# Patient Record
Sex: Female | Born: 1985 | Race: Black or African American | Hispanic: No | Marital: Single | Smoking: Former smoker
Health system: Southern US, Community
[De-identification: ages and names within clinical notes are randomized; demographics above are authoritative.]

## PROBLEM LIST (undated history)

## (undated) ENCOUNTER — Inpatient Hospital Stay: Payer: Self-pay

## (undated) DIAGNOSIS — I10 Essential (primary) hypertension: Secondary | ICD-10-CM

## (undated) DIAGNOSIS — K8020 Calculus of gallbladder without cholecystitis without obstruction: Secondary | ICD-10-CM

## (undated) HISTORY — PX: NO PAST SURGERIES: SHX2092

---

## 2010-04-08 ENCOUNTER — Emergency Department: Payer: Self-pay | Admitting: Emergency Medicine

## 2010-08-03 ENCOUNTER — Emergency Department: Payer: Self-pay | Admitting: Emergency Medicine

## 2010-12-12 ENCOUNTER — Emergency Department: Payer: Self-pay | Admitting: Unknown Physician Specialty

## 2012-07-23 ENCOUNTER — Emergency Department: Payer: Self-pay | Admitting: Emergency Medicine

## 2012-07-23 LAB — URINALYSIS, COMPLETE
Bilirubin,UR: NEGATIVE
Blood: NEGATIVE
Nitrite: NEGATIVE
Ph: 7 (ref 4.5–8.0)
Protein: NEGATIVE
RBC,UR: 1 /HPF (ref 0–5)

## 2012-07-23 LAB — CBC
HCT: 37.5 % (ref 35.0–47.0)
HGB: 12.1 g/dL (ref 12.0–16.0)
Platelet: 265 10*3/uL (ref 150–440)
WBC: 6.4 10*3/uL (ref 3.6–11.0)

## 2012-07-23 LAB — COMPREHENSIVE METABOLIC PANEL
Alkaline Phosphatase: 123 U/L (ref 50–136)
Bilirubin,Total: 0.3 mg/dL (ref 0.2–1.0)
Calcium, Total: 9.1 mg/dL (ref 8.5–10.1)
Chloride: 107 mmol/L (ref 98–107)
Co2: 27 mmol/L (ref 21–32)
EGFR (African American): >60
EGFR (Non-African Amer.): >60
SGPT (ALT): 76 U/L (ref 12–78)
Sodium: 139 mmol/L (ref 136–145)

## 2012-09-07 ENCOUNTER — Emergency Department: Payer: Self-pay | Admitting: Emergency Medicine

## 2012-09-07 LAB — COMPREHENSIVE METABOLIC PANEL
Albumin: 3.8 g/dL (ref 3.4–5.0)
Alkaline Phosphatase: 111 U/L (ref 50–136)
Bilirubin,Total: 0.2 mg/dL (ref 0.2–1.0)
Chloride: 110 mmol/L — ABNORMAL HIGH (ref 98–107)
Creatinine: 0.94 mg/dL (ref 0.60–1.30)
EGFR (African American): >60
EGFR (Non-African Amer.): >60
Glucose: 59 mg/dL — ABNORMAL LOW (ref 65–99)
Osmolality: 282 (ref 275–301)
SGOT(AST): 48 U/L — ABNORMAL HIGH (ref 15–37)
SGPT (ALT): 33 U/L (ref 12–78)
Sodium: 143 mmol/L (ref 136–145)

## 2012-09-07 LAB — URINALYSIS, COMPLETE
Bilirubin,UR: NEGATIVE
Glucose,UR: NEGATIVE mg/dL (ref 0–75)
Ketone: NEGATIVE
Protein: NEGATIVE
RBC,UR: 83 /HPF (ref 0–5)
Specific Gravity: 1.02 (ref 1.003–1.030)
Squamous Epithelial: 11

## 2012-09-07 LAB — CBC
HCT: 39.7 % (ref 35.0–47.0)
MCH: 23.5 pg — ABNORMAL LOW (ref 26.0–34.0)
MCHC: 32.5 g/dL (ref 32.0–36.0)
MCV: 72 fL — ABNORMAL LOW (ref 80–100)
Platelet: 281 10*3/uL (ref 150–440)
RDW: 18.2 % — ABNORMAL HIGH (ref 11.5–14.5)
WBC: 6.6 10*3/uL (ref 3.6–11.0)

## 2013-06-04 ENCOUNTER — Emergency Department: Payer: Self-pay | Admitting: Emergency Medicine

## 2013-06-04 LAB — CBC
HCT: 35.9 % (ref 35.0–47.0)
HGB: 11.7 g/dL — ABNORMAL LOW (ref 12.0–16.0)
MCH: 23.3 pg — ABNORMAL LOW (ref 26.0–34.0)
MCHC: 32.5 g/dL (ref 32.0–36.0)
MCV: 72 fL — ABNORMAL LOW (ref 80–100)
Platelet: 244 x10 3/mm 3 (ref 150–440)
RBC: 5.02 X10 6/mm 3 (ref 3.80–5.20)
RDW: 18.3 % — ABNORMAL HIGH (ref 11.5–14.5)
WBC: 5 x10 3/mm 3 (ref 3.6–11.0)

## 2013-06-04 LAB — URINALYSIS, COMPLETE
Bacteria: NONE SEEN
Bilirubin,UR: NEGATIVE
Blood: NEGATIVE
Glucose,UR: NEGATIVE mg/dL (ref 0–75)
Ketone: NEGATIVE
Leukocyte Esterase: NEGATIVE
Nitrite: NEGATIVE
Ph: 7 (ref 4.5–8.0)
Protein: NEGATIVE
RBC,UR: <1 /HPF (ref 0–5)
Specific Gravity: 1.01 (ref 1.003–1.030)
Squamous Epithelial: 2
WBC UR: 1 /HPF (ref 0–5)

## 2013-06-04 LAB — COMPREHENSIVE METABOLIC PANEL
Alkaline Phosphatase: 94 U/L (ref 50–136)
Anion Gap: 4 — ABNORMAL LOW (ref 7–16)
BUN: 9 mg/dL (ref 7–18)
Bilirubin,Total: 0.2 mg/dL (ref 0.2–1.0)
Calcium, Total: 8.6 mg/dL (ref 8.5–10.1)
Chloride: 110 mmol/L — ABNORMAL HIGH (ref 98–107)
Co2: 25 mmol/L (ref 21–32)
Osmolality: 276 (ref 275–301)
Potassium: 3.8 mmol/L (ref 3.5–5.1)
SGOT(AST): 12 U/L — ABNORMAL LOW (ref 15–37)
SGPT (ALT): 15 U/L (ref 12–78)

## 2013-06-04 LAB — HCG, QUANTITATIVE, PREGNANCY: Beta Hcg, Quant.: 9231 m[IU]/mL — ABNORMAL HIGH

## 2013-06-04 LAB — WET PREP, GENITAL

## 2013-08-11 ENCOUNTER — Emergency Department: Payer: Self-pay | Admitting: Emergency Medicine

## 2013-08-11 LAB — CBC
HCT: 33.9 % — ABNORMAL LOW (ref 35.0–47.0)
HGB: 11.2 g/dL — ABNORMAL LOW (ref 12.0–16.0)
MCH: 23.7 pg — ABNORMAL LOW (ref 26.0–34.0)
MCHC: 33.1 g/dL (ref 32.0–36.0)
MCV: 72 fL — ABNORMAL LOW (ref 80–100)
Platelet: 200 10*3/uL (ref 150–440)
RBC: 4.73 10*6/uL (ref 3.80–5.20)
RDW: 17.7 % — ABNORMAL HIGH (ref 11.5–14.5)

## 2013-08-11 LAB — COMPREHENSIVE METABOLIC PANEL
Albumin: 3.1 g/dL — ABNORMAL LOW (ref 3.4–5.0)
Alkaline Phosphatase: 71 U/L (ref 50–136)
Anion Gap: 6 — ABNORMAL LOW (ref 7–16)
BUN: 5 mg/dL — ABNORMAL LOW (ref 7–18)
Bilirubin,Total: 0.2 mg/dL (ref 0.2–1.0)
Calcium, Total: 8.7 mg/dL (ref 8.5–10.1)
Chloride: 106 mmol/L (ref 98–107)
Co2: 23 mmol/L (ref 21–32)
EGFR (African American): >60
EGFR (Non-African Amer.): >60
Glucose: 69 mg/dL (ref 65–99)
Osmolality: 266 (ref 275–301)
SGPT (ALT): 14 U/L (ref 12–78)
Total Protein: 7 g/dL (ref 6.4–8.2)

## 2013-08-11 LAB — URINALYSIS, COMPLETE
Bilirubin,UR: NEGATIVE
Nitrite: POSITIVE
Ph: 7 (ref 4.5–8.0)
Protein: NEGATIVE
RBC,UR: 3 /HPF (ref 0–5)
Specific Gravity: 1.014 (ref 1.003–1.030)
WBC UR: 17 /HPF (ref 0–5)

## 2013-08-12 LAB — HCG, QUANTITATIVE, PREGNANCY: Beta Hcg, Quant.: 18151 m[IU]/mL — ABNORMAL HIGH

## 2013-09-26 ENCOUNTER — Observation Stay: Payer: Self-pay | Admitting: Obstetrics and Gynecology

## 2013-09-26 LAB — URINALYSIS, COMPLETE
Bacteria: NONE SEEN
Bilirubin,UR: NEGATIVE
Blood: NEGATIVE
Glucose,UR: NEGATIVE mg/dL (ref 0–75)
Ph: 8 (ref 4.5–8.0)
Protein: NEGATIVE
RBC,UR: 2 /HPF (ref 0–5)
Squamous Epithelial: 1
WBC UR: 6 /HPF (ref 0–5)

## 2013-10-05 IMAGING — CR DG CHEST 2V
1 series · 2 of 2 positions shown · non-contrast
Comparison: none

REASON FOR EXAM: Chest Pain
COMMENTS:

PROCEDURE:     DXR - DXR CHEST PA (OR AP) AND LATERAL  - July 23, 2012  [DATE]
RESULT:     Comparison: None

[Series 1: pa · 0.17mm/px · 2 of 2 slices shown]
[im 1/2]
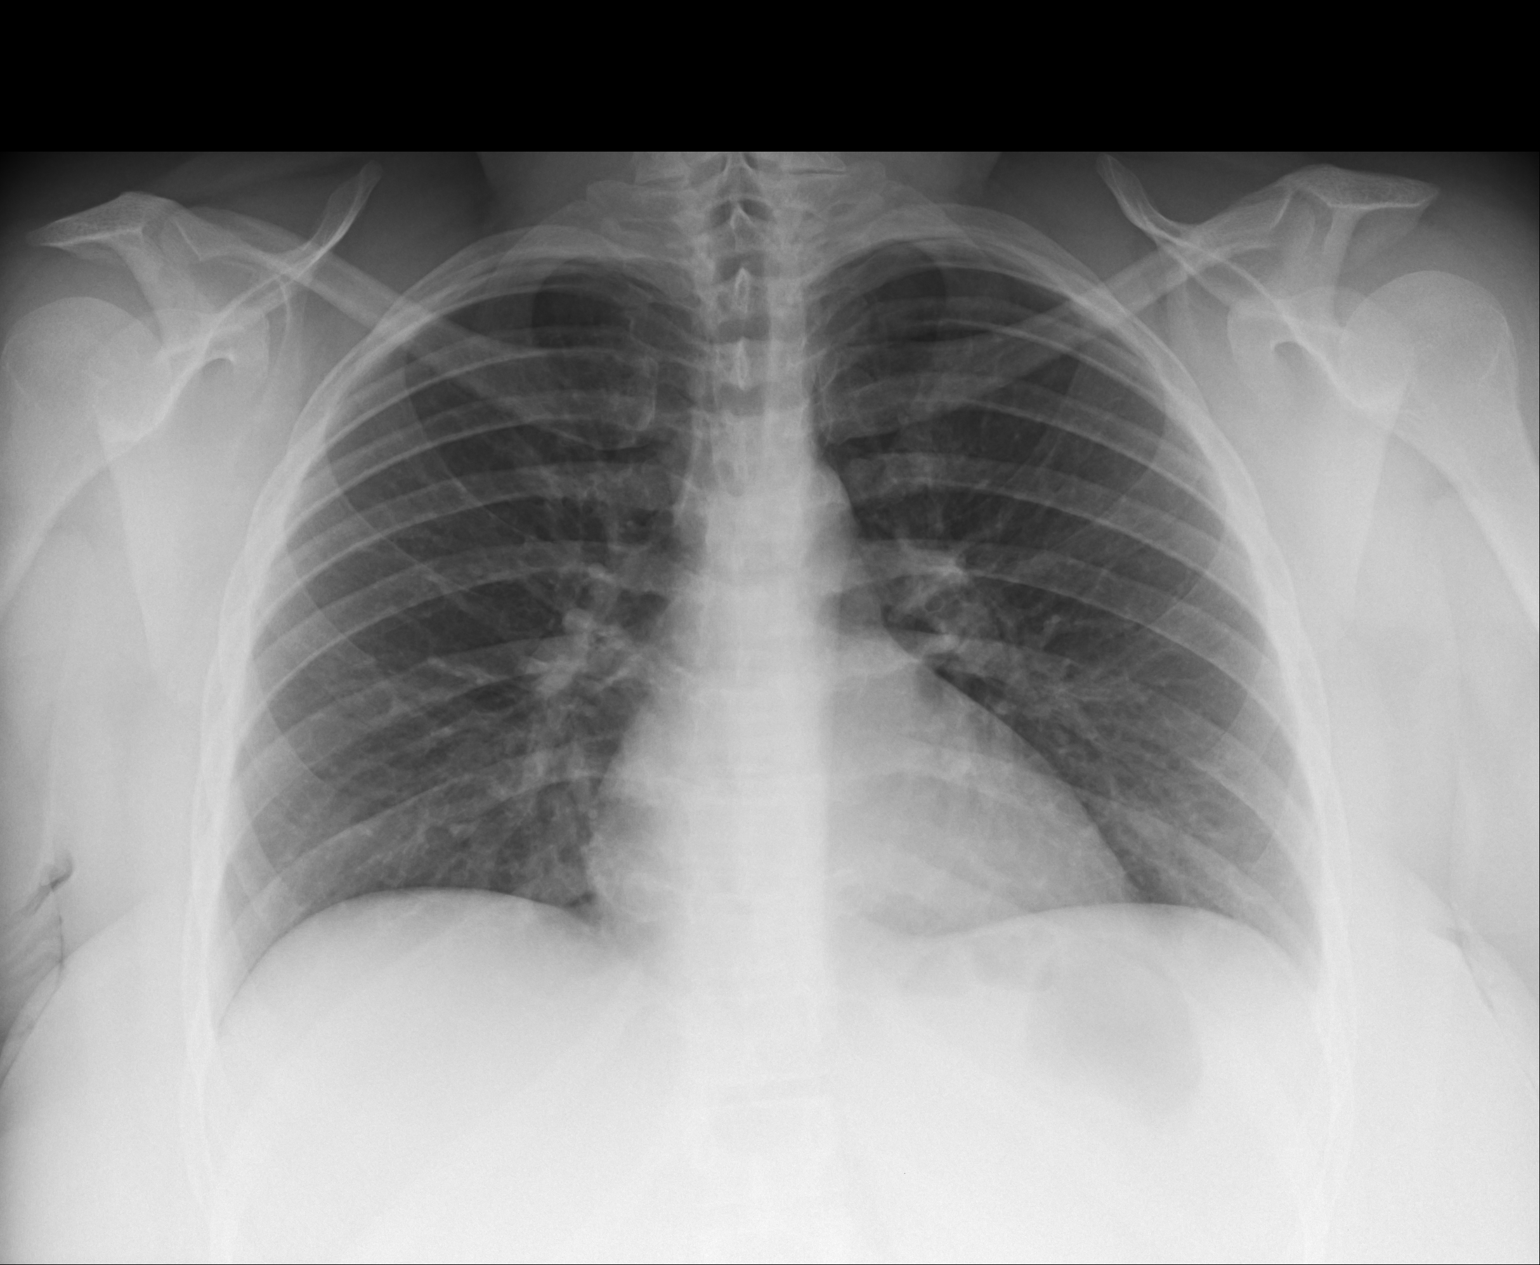
[im 2/2]
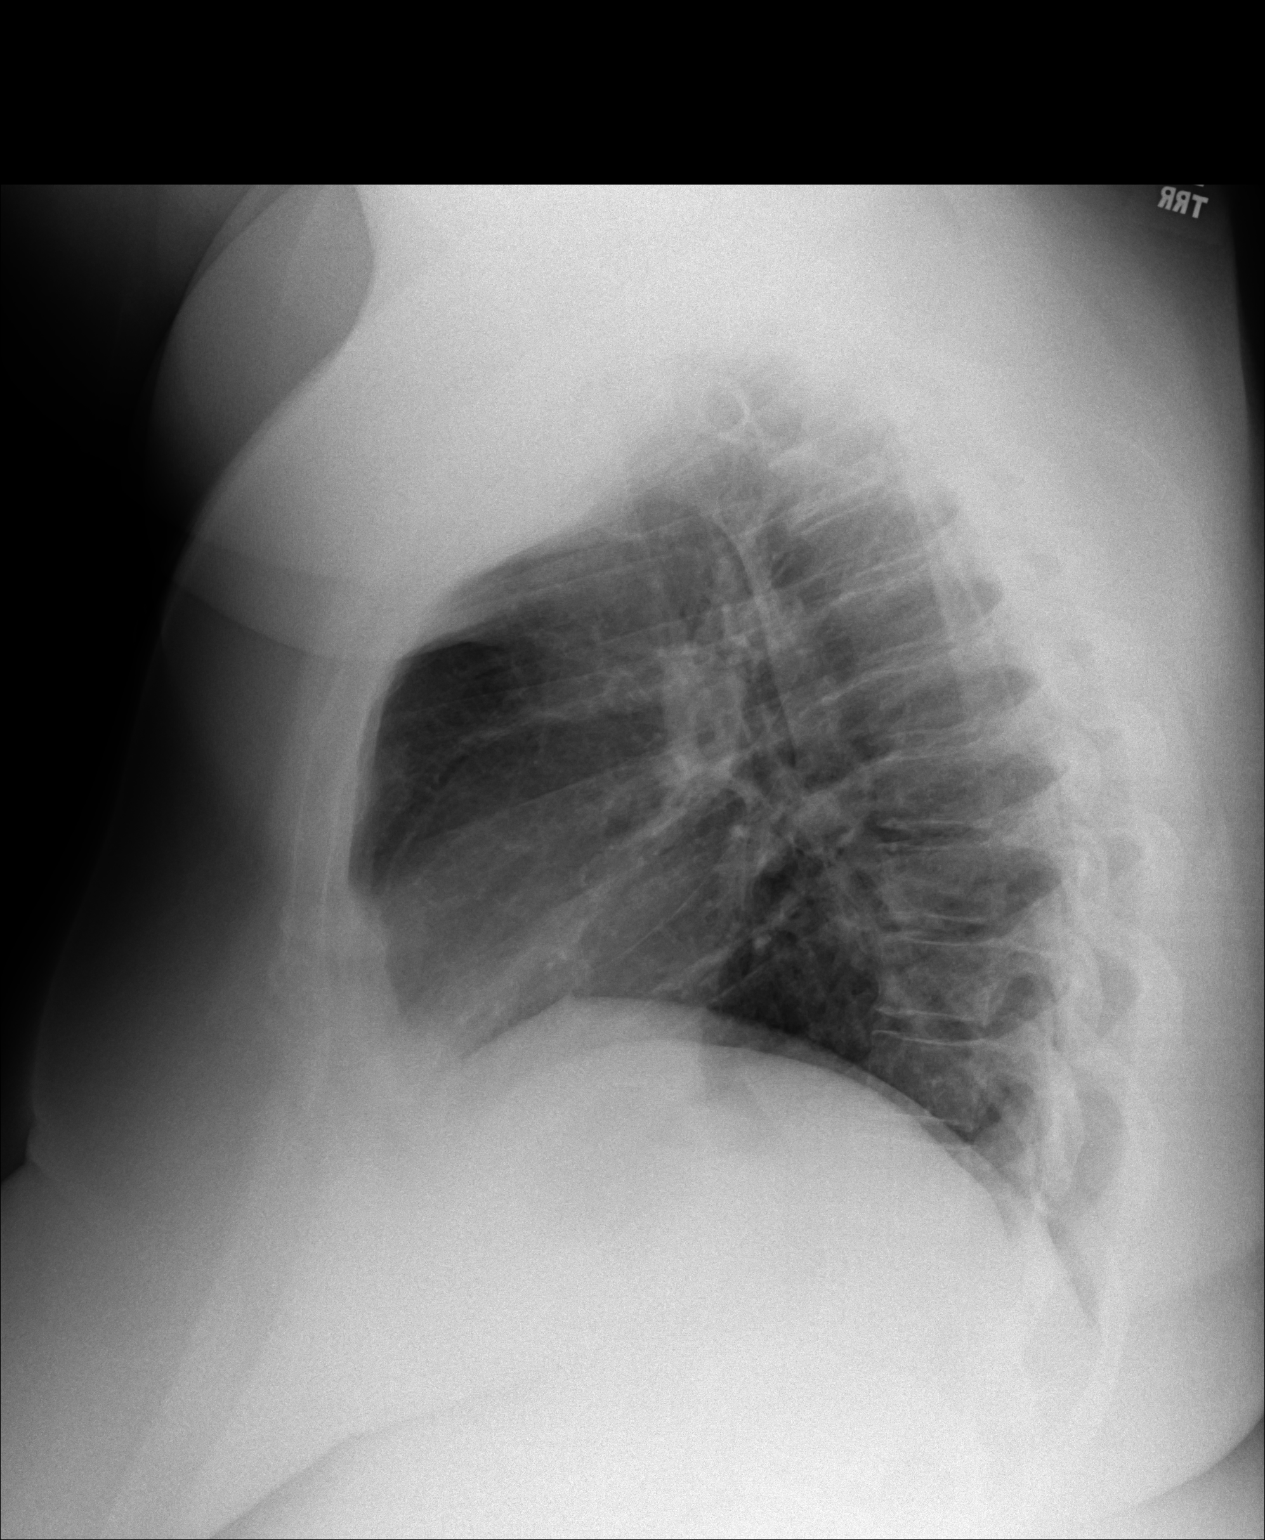

[2 of 2 positions shown; findings below may reference images not displayed]

FINDINGS: PA and lateral chest radiographs are provided.  There is no focal
parenchymal opacity, pleural effusion, or pneumothorax. The heart and
mediastinum are unremarkable.  The osseous structures are unremarkable.
IMPRESSION: No acute disease of the che[REDACTED]

## 2013-10-07 ENCOUNTER — Observation Stay: Payer: Self-pay | Admitting: Obstetrics and Gynecology

## 2013-10-07 LAB — URINALYSIS, COMPLETE
Bilirubin,UR: NEGATIVE
Glucose,UR: NEGATIVE mg/dL (ref 0–75)
Ketone: NEGATIVE
Nitrite: NEGATIVE
Ph: 7 (ref 4.5–8.0)
Protein: 30
RBC,UR: 65 /HPF (ref 0–5)
Squamous Epithelial: 44

## 2013-10-07 LAB — GC/CHLAMYDIA PROBE AMP

## 2013-10-09 LAB — URINE CULTURE

## 2013-10-15 ENCOUNTER — Emergency Department: Payer: Self-pay | Admitting: Emergency Medicine

## 2013-10-16 ENCOUNTER — Emergency Department: Payer: Self-pay | Admitting: Emergency Medicine

## 2013-10-20 ENCOUNTER — Observation Stay: Payer: Self-pay | Admitting: Obstetrics and Gynecology

## 2013-10-20 LAB — URINALYSIS, COMPLETE
Bilirubin,UR: NEGATIVE
Blood: NEGATIVE
Glucose,UR: NEGATIVE mg/dL (ref 0–75)
Hyaline Cast: 2
Nitrite: NEGATIVE
Ph: 7 (ref 4.5–8.0)
RBC,UR: 4 /HPF (ref 0–5)
Specific Gravity: 1.018 (ref 1.003–1.030)
Squamous Epithelial: 27
WBC UR: 20 /HPF (ref 0–5)

## 2013-11-03 ENCOUNTER — Emergency Department: Payer: Self-pay | Admitting: Emergency Medicine

## 2013-11-17 ENCOUNTER — Observation Stay: Payer: Self-pay | Admitting: Obstetrics and Gynecology

## 2013-11-29 ENCOUNTER — Observation Stay: Payer: Self-pay | Admitting: Obstetrics and Gynecology

## 2013-11-29 LAB — CBC WITH DIFFERENTIAL/PLATELET
Basophil #: 0 10*3/uL (ref 0.0–0.1)
Basophil %: 0.3 %
Eosinophil #: 0.1 10*3/uL (ref 0.0–0.7)
Eosinophil %: 1.3 %
HCT: 31.7 % — ABNORMAL LOW (ref 35.0–47.0)
HGB: 10.3 g/dL — ABNORMAL LOW (ref 12.0–16.0)
Lymphocyte %: 24.2 %
MCH: 23.5 pg — ABNORMAL LOW (ref 26.0–34.0)
MCHC: 32.5 g/dL (ref 32.0–36.0)
MCV: 72 fL — ABNORMAL LOW (ref 80–100)
Monocyte #: 0.7 x10 3/mm (ref 0.2–0.9)
Neutrophil %: 65.7 %
Platelet: 236 10*3/uL (ref 150–440)
RBC: 4.39 10*6/uL (ref 3.80–5.20)
RDW: 16.9 % — ABNORMAL HIGH (ref 11.5–14.5)
WBC: 7.9 10*3/uL (ref 3.6–11.0)

## 2014-01-11 DIAGNOSIS — IMO0002 Reserved for concepts with insufficient information to code with codable children: Secondary | ICD-10-CM

## 2014-01-29 ENCOUNTER — Emergency Department: Payer: Self-pay | Admitting: Emergency Medicine

## 2014-03-08 ENCOUNTER — Emergency Department: Payer: Self-pay | Admitting: Emergency Medicine

## 2014-04-29 ENCOUNTER — Emergency Department: Payer: Self-pay | Admitting: Emergency Medicine

## 2014-04-29 LAB — URINALYSIS, COMPLETE
BILIRUBIN, UR: NEGATIVE
GLUCOSE, UR: NEGATIVE mg/dL (ref 0–75)
KETONE: NEGATIVE
Nitrite: NEGATIVE
Ph: 5 (ref 4.5–8.0)
Protein: 100
RBC,UR: 22 /HPF (ref 0–5)
Specific Gravity: 1.028 (ref 1.003–1.030)
Squamous Epithelial: 11
WBC UR: 18 /HPF (ref 0–5)

## 2014-04-29 LAB — COMPREHENSIVE METABOLIC PANEL
ANION GAP: 10 (ref 7–16)
AST: 18 U/L (ref 15–37)
Albumin: 4 g/dL (ref 3.4–5.0)
Alkaline Phosphatase: 111 U/L
BILIRUBIN TOTAL: 0.3 mg/dL (ref 0.2–1.0)
BUN: 9 mg/dL (ref 7–18)
CO2: 21 mmol/L (ref 21–32)
Calcium, Total: 9.6 mg/dL (ref 8.5–10.1)
Chloride: 108 mmol/L — ABNORMAL HIGH (ref 98–107)
Creatinine: 0.88 mg/dL (ref 0.60–1.30)
EGFR (African American): >60
Glucose: 102 mg/dL — ABNORMAL HIGH (ref 65–99)
OSMOLALITY: 276 (ref 275–301)
POTASSIUM: 3.2 mmol/L — AB (ref 3.5–5.1)
SGPT (ALT): 28 U/L (ref 12–78)
Sodium: 139 mmol/L (ref 136–145)
Total Protein: 8.6 g/dL — ABNORMAL HIGH (ref 6.4–8.2)

## 2014-04-29 LAB — CBC WITH DIFFERENTIAL/PLATELET
BASOS PCT: 1.2 %
Basophil #: 0.1 10*3/uL (ref 0.0–0.1)
EOS ABS: 0 10*3/uL (ref 0.0–0.7)
Eosinophil %: 0.6 %
HCT: 41.5 % (ref 35.0–47.0)
HGB: 12.9 g/dL (ref 12.0–16.0)
Lymphocyte #: 2.4 10*3/uL (ref 1.0–3.6)
Lymphocyte %: 50.2 %
MCH: 22.2 pg — ABNORMAL LOW (ref 26.0–34.0)
MCHC: 31.1 g/dL — AB (ref 32.0–36.0)
MCV: 71 fL — ABNORMAL LOW (ref 80–100)
MONOS PCT: 2 %
Monocyte #: 0.1 x10 3/mm — ABNORMAL LOW (ref 0.2–0.9)
NEUTROS ABS: 2.2 10*3/uL (ref 1.4–6.5)
Neutrophil %: 46 %
Platelet: 315 10*3/uL (ref 150–440)
RBC: 5.82 10*6/uL — ABNORMAL HIGH (ref 3.80–5.20)
RDW: 19.1 % — ABNORMAL HIGH (ref 11.5–14.5)
WBC: 4.7 10*3/uL (ref 3.6–11.0)

## 2014-04-29 LAB — LIPASE, BLOOD: Lipase: 105 U/L (ref 73–393)

## 2014-08-01 ENCOUNTER — Emergency Department: Payer: Self-pay | Admitting: Emergency Medicine

## 2014-08-01 LAB — BASIC METABOLIC PANEL
Anion Gap: 8 (ref 7–16)
BUN: 9 mg/dL (ref 7–18)
CALCIUM: 8.2 mg/dL — AB (ref 8.5–10.1)
CREATININE: 0.82 mg/dL (ref 0.60–1.30)
Chloride: 110 mmol/L — ABNORMAL HIGH (ref 98–107)
Co2: 23 mmol/L (ref 21–32)
EGFR (African American): >60
Glucose: 76 mg/dL (ref 65–99)
Osmolality: 279 (ref 275–301)
Potassium: 3.7 mmol/L (ref 3.5–5.1)
SODIUM: 141 mmol/L (ref 136–145)

## 2014-08-01 LAB — TROPONIN I: Troponin-I: <0.02 ng/mL

## 2014-08-01 LAB — CBC
HCT: 36.4 % (ref 35.0–47.0)
HGB: 11.3 g/dL — ABNORMAL LOW (ref 12.0–16.0)
MCH: 22.9 pg — AB (ref 26.0–34.0)
MCHC: 31.1 g/dL — AB (ref 32.0–36.0)
MCV: 74 fL — ABNORMAL LOW (ref 80–100)
PLATELETS: 232 10*3/uL (ref 150–440)
RBC: 4.93 10*6/uL (ref 3.80–5.20)
RDW: 18.4 % — AB (ref 11.5–14.5)
WBC: 4.8 10*3/uL (ref 3.6–11.0)

## 2014-08-01 LAB — PRO B NATRIURETIC PEPTIDE: B-Type Natriuretic Peptide: 41 pg/mL (ref 0–125)

## 2014-09-13 ENCOUNTER — Emergency Department: Payer: Self-pay | Admitting: Emergency Medicine

## 2014-09-13 LAB — COMPREHENSIVE METABOLIC PANEL
AST: 17 U/L (ref 15–37)
Albumin: 3.4 g/dL (ref 3.4–5.0)
Alkaline Phosphatase: 113 U/L
Anion Gap: 2 — ABNORMAL LOW (ref 7–16)
BILIRUBIN TOTAL: 0.1 mg/dL — AB (ref 0.2–1.0)
BUN: 11 mg/dL (ref 7–18)
CALCIUM: 8.8 mg/dL (ref 8.5–10.1)
CHLORIDE: 109 mmol/L — AB (ref 98–107)
CO2: 29 mmol/L (ref 21–32)
Creatinine: 0.87 mg/dL (ref 0.60–1.30)
EGFR (Non-African Amer.): >60
GLUCOSE: 100 mg/dL — AB (ref 65–99)
OSMOLALITY: 279 (ref 275–301)
POTASSIUM: 3.9 mmol/L (ref 3.5–5.1)
SGPT (ALT): 21 U/L
SODIUM: 140 mmol/L (ref 136–145)
Total Protein: 7.6 g/dL (ref 6.4–8.2)

## 2014-09-13 LAB — URINALYSIS, COMPLETE
BILIRUBIN, UR: NEGATIVE
BLOOD: NEGATIVE
Bacteria: NONE SEEN
GLUCOSE, UR: NEGATIVE mg/dL (ref 0–75)
Ketone: NEGATIVE
NITRITE: NEGATIVE
Ph: 5 (ref 4.5–8.0)
Protein: NEGATIVE
Specific Gravity: 1.02 (ref 1.003–1.030)
WBC UR: 3 /HPF (ref 0–5)

## 2014-09-13 LAB — CBC WITH DIFFERENTIAL/PLATELET
BASOS PCT: 0.6 %
Basophil #: 0 10*3/uL (ref 0.0–0.1)
Eosinophil #: 0.1 10*3/uL (ref 0.0–0.7)
Eosinophil %: 2.2 %
HCT: 37.9 % (ref 35.0–47.0)
HGB: 11.7 g/dL — ABNORMAL LOW (ref 12.0–16.0)
LYMPHS ABS: 2 10*3/uL (ref 1.0–3.6)
LYMPHS PCT: 39.6 %
MCH: 22.7 pg — ABNORMAL LOW (ref 26.0–34.0)
MCHC: 30.9 g/dL — AB (ref 32.0–36.0)
MCV: 73 fL — ABNORMAL LOW (ref 80–100)
MONO ABS: 0.3 x10 3/mm (ref 0.2–0.9)
Monocyte %: 5.1 %
NEUTROS ABS: 2.6 10*3/uL (ref 1.4–6.5)
Neutrophil %: 52.5 %
Platelet: 239 10*3/uL (ref 150–440)
RBC: 5.16 10*6/uL (ref 3.80–5.20)
RDW: 17.4 % — AB (ref 11.5–14.5)
WBC: 5 10*3/uL (ref 3.6–11.0)

## 2014-09-13 LAB — HCG, QUANTITATIVE, PREGNANCY: Beta Hcg, Quant.: <1 m[IU]/mL — ABNORMAL LOW

## 2014-09-13 LAB — LIPASE, BLOOD: Lipase: 76 U/L (ref 73–393)

## 2015-10-14 IMAGING — CR DG CHEST 1V PORT
1 series · 1 of 1 positions shown · non-contrast
Comparison: None.

CLINICAL DATA: Chest pain

EXAM:
PORTABLE CHEST - 1 VIEW

[ap]
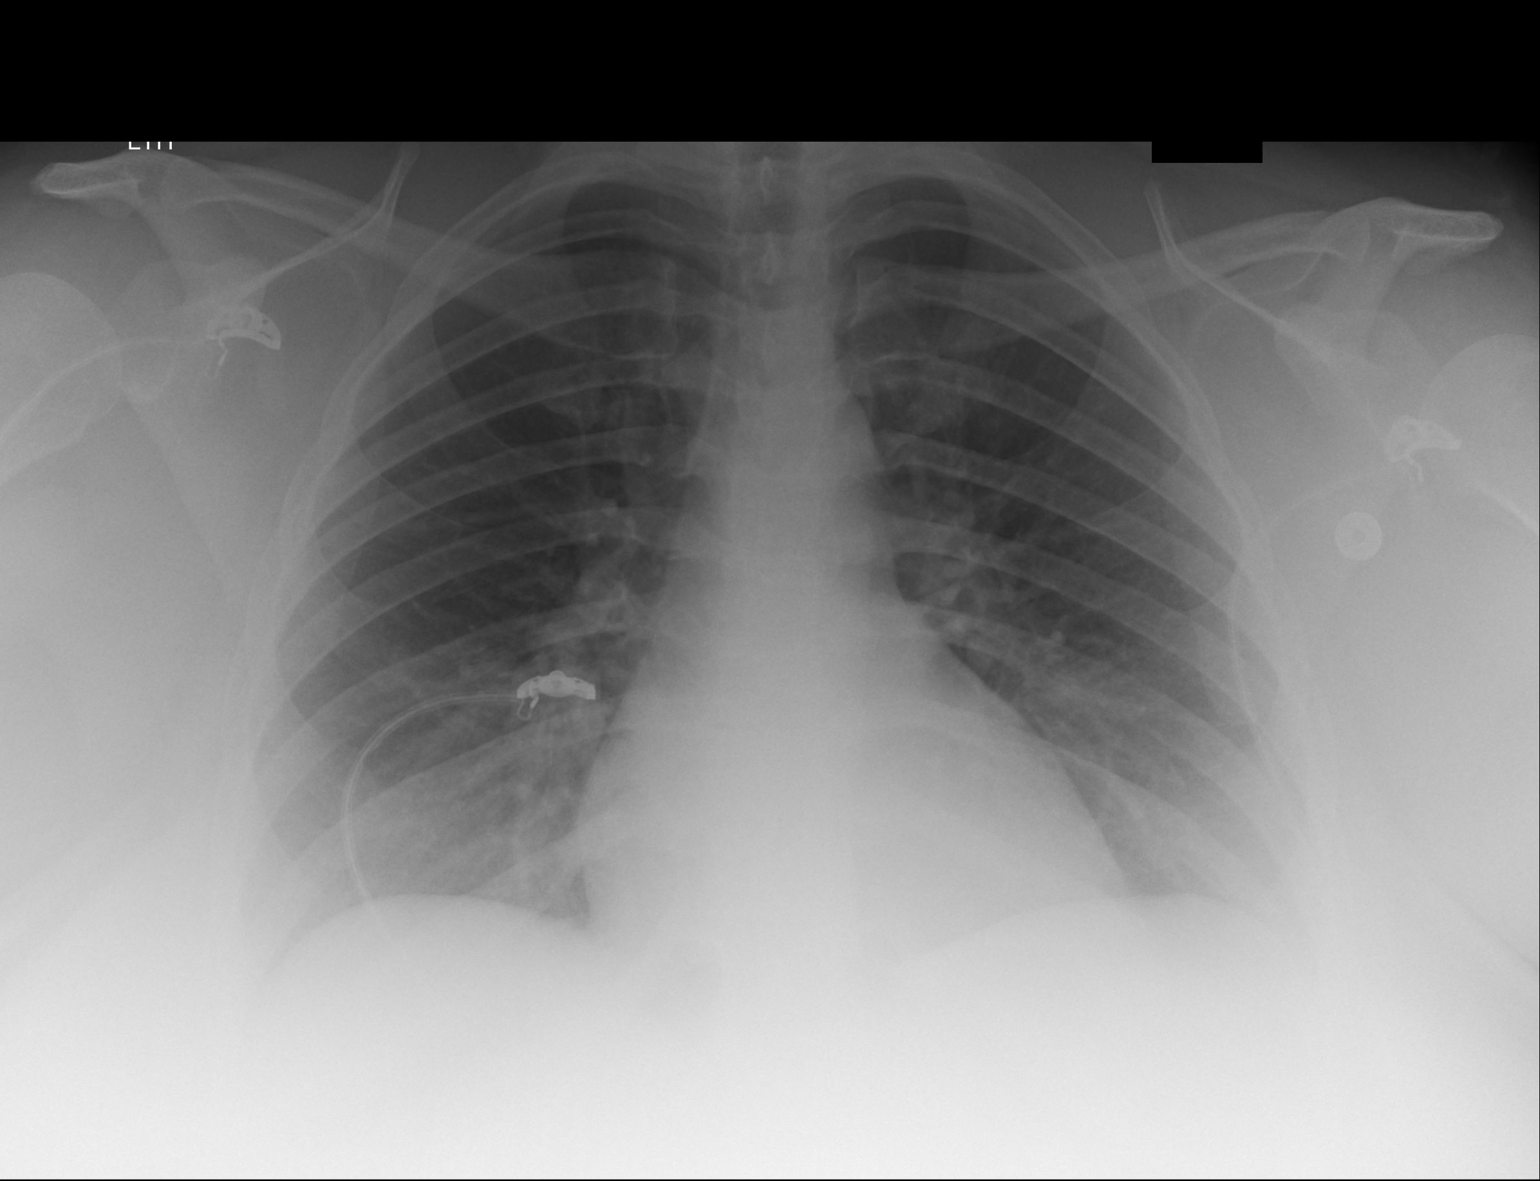

[1 of 1 positions shown; findings below may reference images not displayed]

FINDINGS: The heart size and mediastinal contours are within normal limits.
Both lungs are clear. The visualized skeletal structures are
unremarkable.
IMPRESSION: No active disease.

## 2015-12-05 NOTE — L&D Delivery Note (Signed)
Delivery Note At 6:00 AM a viable female was delivered via  (Presentation: ;LOA  ).  APGAR: 7/9, ; weight Pending  .   Placenta status: , .3V Cord:  Anesthesia:  Epidural Episiotomy:  None Lacerations:  None Suture Repair: n/a Est. Blood Loss (mL):  150  Mom to postpartum.  Baby to Couplet care / Skin to Skin.  Stacie Diette 08/04/2016, 6:12 AM  I was present for the above and agree.  CRESENZO-DISHMAN,Faithann Natal

## 2015-12-26 ENCOUNTER — Emergency Department: Payer: Self-pay

## 2015-12-26 ENCOUNTER — Emergency Department
Admission: EM | Admit: 2015-12-26 | Discharge: 2015-12-26 | Disposition: A | Discharge status: Home / Self Care | Payer: Self-pay | Attending: Emergency Medicine | Admitting: Emergency Medicine

## 2015-12-26 ENCOUNTER — Encounter: Payer: Self-pay | Admitting: Emergency Medicine

## 2015-12-26 DIAGNOSIS — O219 Vomiting of pregnancy, unspecified: Secondary | ICD-10-CM

## 2015-12-26 DIAGNOSIS — Z87891 Personal history of nicotine dependence: Secondary | ICD-10-CM | POA: Insufficient documentation

## 2015-12-26 DIAGNOSIS — R112 Nausea with vomiting, unspecified: Secondary | ICD-10-CM

## 2015-12-26 DIAGNOSIS — O30041 Twin pregnancy, dichorionic/diamniotic, first trimester: Secondary | ICD-10-CM | POA: Insufficient documentation

## 2015-12-26 DIAGNOSIS — O30001 Twin pregnancy, unspecified number of placenta and unspecified number of amniotic sacs, first trimester: Secondary | ICD-10-CM

## 2015-12-26 DIAGNOSIS — Z3A01 Less than 8 weeks gestation of pregnancy: Secondary | ICD-10-CM | POA: Insufficient documentation

## 2015-12-26 DIAGNOSIS — O99611 Diseases of the digestive system complicating pregnancy, first trimester: Secondary | ICD-10-CM | POA: Insufficient documentation

## 2015-12-26 DIAGNOSIS — Z880 Allergy status to penicillin: Secondary | ICD-10-CM | POA: Insufficient documentation

## 2015-12-26 DIAGNOSIS — K8020 Calculus of gallbladder without cholecystitis without obstruction: Secondary | ICD-10-CM | POA: Insufficient documentation

## 2015-12-26 LAB — CBC
HCT: 37.3 % (ref 35.0–47.0)
Hemoglobin: 11.9 g/dL — ABNORMAL LOW (ref 12.0–16.0)
MCH: 22.9 pg — ABNORMAL LOW (ref 26.0–34.0)
MCHC: 31.8 g/dL — AB (ref 32.0–36.0)
MCV: 71.9 fL — ABNORMAL LOW (ref 80.0–100.0)
Platelets: 240 10*3/uL (ref 150–440)
RBC: 5.19 MIL/uL (ref 3.80–5.20)
RDW: 17.9 % — AB (ref 11.5–14.5)
WBC: 5.3 10*3/uL (ref 3.6–11.0)

## 2015-12-26 LAB — POCT PREGNANCY, URINE
PREG TEST UR: POSITIVE — AB
Preg Test, Ur: POSITIVE — AB

## 2015-12-26 LAB — COMPREHENSIVE METABOLIC PANEL
ALBUMIN: 3.8 g/dL (ref 3.5–5.0)
ALK PHOS: 65 U/L (ref 38–126)
ALT: 8 U/L — ABNORMAL LOW (ref 14–54)
ANION GAP: 6 (ref 5–15)
AST: 11 U/L — AB (ref 15–41)
BILIRUBIN TOTAL: 0.6 mg/dL (ref 0.3–1.2)
BUN: 8 mg/dL (ref 6–20)
CALCIUM: 9.1 mg/dL (ref 8.9–10.3)
CO2: 23 mmol/L (ref 22–32)
Chloride: 107 mmol/L (ref 101–111)
Creatinine, Ser: 0.66 mg/dL (ref 0.44–1.00)
GFR calc Af Amer: >60 mL/min (ref >60)
GLUCOSE: 87 mg/dL (ref 65–99)
Potassium: 3.6 mmol/L (ref 3.5–5.1)
Sodium: 136 mmol/L (ref 135–145)
TOTAL PROTEIN: 8 g/dL (ref 6.5–8.1)

## 2015-12-26 LAB — URINALYSIS COMPLETE WITH MICROSCOPIC (ARMC ONLY)
BILIRUBIN URINE: NEGATIVE
GLUCOSE, UA: NEGATIVE mg/dL
Hgb urine dipstick: NEGATIVE
Leukocytes, UA: NEGATIVE
NITRITE: NEGATIVE
Protein, ur: NEGATIVE mg/dL
SPECIFIC GRAVITY, URINE: 1.026 (ref 1.005–1.030)
pH: 6 (ref 5.0–8.0)

## 2015-12-26 LAB — HCG, QUANTITATIVE, PREGNANCY: hCG, Beta Chain, Quant, S: 75217 m[IU]/mL — ABNORMAL HIGH (ref <5)

## 2015-12-26 LAB — LIPASE, BLOOD: Lipase: 17 U/L (ref 11–51)

## 2015-12-26 MED ORDER — DEXTROSE 5 % AND 0.9 % NACL IV BOLUS
1000.0000 mL | Freq: Once | INTRAVENOUS | Status: AC
  Administered 2015-12-26: 1000 mL via INTRAVENOUS

## 2015-12-26 MED ORDER — ONDANSETRON 4 MG PO TBDP
4.0000 mg | ORAL_TABLET | Freq: Once | ORAL | Status: AC | PRN
  Administered 2015-12-26: 4 mg via ORAL
  Filled 2015-12-26: qty 1

## 2015-12-26 MED ORDER — METOCLOPRAMIDE HCL 5 MG PO TABS: 5.0000 mg | ORAL_TABLET | Freq: Three times a day (TID) | ORAL | Status: DC

## 2015-12-26 MED ORDER — PROMETHAZINE HCL 25 MG/ML IJ SOLN
25.0000 mg | Freq: Once | INTRAMUSCULAR | Status: AC
  Administered 2015-12-26: 25 mg via INTRAVENOUS

## 2015-12-26 MED ORDER — SODIUM CHLORIDE 0.9 % IV BOLUS (SEPSIS): 1000.0000 mL | Freq: Once | INTRAVENOUS | Status: DC

## 2015-12-26 MED ORDER — METOCLOPRAMIDE HCL 5 MG/ML IJ SOLN
10.0000 mg | Freq: Once | INTRAMUSCULAR | Status: AC
  Administered 2015-12-26: 10 mg via INTRAVENOUS
  Filled 2015-12-26: qty 2

## 2015-12-26 MED ORDER — PROMETHAZINE HCL 12.5 MG PO TABS: 12.5000 mg | ORAL_TABLET | Freq: Four times a day (QID) | ORAL | Status: DC | PRN

## 2015-12-26 MED ORDER — PROMETHAZINE HCL 25 MG RE SUPP: 25.0000 mg | Freq: Four times a day (QID) | RECTAL | Status: DC | PRN

## 2015-12-26 MED ORDER — PROMETHAZINE HCL 25 MG/ML IJ SOLN
INTRAMUSCULAR | Status: AC
  Filled 2015-12-26: qty 1

## 2015-12-26 NOTE — ED Notes (Signed)
Pt states "I have no been able to keep anything on my stomach for the last 4-5 days". Pt states constant nausea since then. Pt states she has not been eating or drinking normally since then. Pt states she is starting to "dry heave because there is nothing else coming up". Pt denies exposure to anyone that has been sick. States diarrhea "a couple days ago". Pt denies fevers/chills since then. Pt states abdominal pain across the top of her stomach on both sides at this time.

## 2015-12-26 NOTE — ED Notes (Signed)
Patient transported to Ultrasound 

## 2015-12-26 NOTE — ED Notes (Signed)
NAD noted at time of D/C. Pt taken to the lobby via wheelchair at this time by her significant other. Pt denies comments/concerns at this time.

## 2015-12-26 NOTE — Discharge Instructions (Signed)
You have a pregnancy with twins. With this, your pregnancy hormone level can go higher and you can have additional nausea and morning sickness. The ultrasound of the gallbladder showed gallstones but no sign of inflammation.  Follow-up with an obstetrician for ongoing care of this pregnancy.  Return to the emergency department if you have uncontrolled nausea, can't eat or drink adequately, or if you have other urgent concerns.  Cholelithiasis Cholelithiasis (also called gallstones) is a form of gallbladder disease in which gallstones form in your gallbladder. The gallbladder is an organ that stores bile made in the liver, which helps digest fats. Gallstones begin as small crystals and slowly grow into stones. Gallstone pain occurs when the gallbladder spasms and a gallstone is blocking the duct. Pain can also occur when a stone passes out of the duct.  RISK FACTORS  Being female.   Having multiple pregnancies. Health care providers sometimes advise removing diseased gallbladders before future pregnancies.   Being obese.  Eating a diet heavy in fried foods and fat.   Being older than 60 years and increasing age.   Prolonged use of medicines containing female hormones.   Having diabetes mellitus.   Rapidly losing weight.   Having a family history of gallstones (heredity).  SYMPTOMS  Nausea.   Vomiting.  Abdominal pain.   Yellowing of the skin (jaundice).   Sudden pain. It may persist from several minutes to several hours.  Fever.   Tenderness to the touch. In some cases, when gallstones do not move into the bile duct, people have no pain or symptoms. These are called "silent" gallstones.  TREATMENT Silent gallstones do not need treatment. In severe cases, emergency surgery may be required. Options for treatment include:  Surgery to remove the gallbladder. This is the most common treatment.  Medicines. These do not always work and may take 6-12 months or more  to work.  Shock wave treatment (extracorporeal biliary lithotripsy). In this treatment an ultrasound machine sends shock waves to the gallbladder to break gallstones into smaller pieces that can pass into the intestines or be dissolved by medicine. HOME CARE INSTRUCTIONS   Only take over-the-counter or prescription medicines for pain, discomfort, or fever as directed by your health care provider.   Follow a low-fat diet until seen again by your health care provider. Fat causes the gallbladder to contract, which can result in pain.   Follow up with your health care provider as directed. Attacks are almost always recurrent and surgery is usually required for permanent treatment.  SEEK IMMEDIATE MEDICAL CARE IF:   Your pain increases and is not controlled by medicines.   You have a fever or persistent symptoms for more than 2-3 days.   You have a fever and your symptoms suddenly get worse.   You have persistent nausea and vomiting.  MAKE SURE YOU:   Understand these instructions.  Will watch your condition.  Will get help right away if you are not doing well or get worse.   This information is not intended to replace advice given to you by your health care provider. Make sure you discuss any questions you have with your health care provider.   Document Released: 11/16/2005 Document Revised: 07/23/2013 Document Reviewed: 05/14/2013 Elsevier Interactive Patient Education [employer]

## 2015-12-26 NOTE — ED Provider Notes (Signed)
[facility] Emergency Department Provider Note  ____________________________________________  Time seen: 1825  I have reviewed the triage vital signs and the nursing notes.  History by:  Patient along with her husband.  HISTORY  Chief Complaint Nausea; Emesis; and Abdominal Pain     HPI Mary Hensley is a 30 y.o. female who reports that she has had nausea and vomiting for the past 4-5 days. She also had some diarrhea previous to that. She reports she has ongoing discomfort in her upper abdomen. She does have a history of cholelithiasis. She denies any fever. She reports not having had a menstrual cycle since 2013, but denies lower abdominal pain.  Past medical history:  History of cholelithiasis History of amenorrhea   There are no active problems to display for this patient.   History reviewed. No pertinent past surgical history.  Current Outpatient Rx  Name  Route  Sig  Dispense  Refill  . promethazine (PHENERGAN) 12.5 MG tablet   Oral   Take 1 tablet (12.5 mg total) by mouth every 6 (six) hours as needed for nausea or vomiting.   12 tablet   1   . promethazine (PHENERGAN) 25 MG suppository   Rectal   Place 1 suppository (25 mg total) rectally every 6 (six) hours as needed for nausea.   12 suppository   1     Allergies Penicillins  History reviewed. No pertinent family history.  Social History Social History  Substance Use Topics  . Smoking status: Former Smoker    Quit date: 11/27/2015  . Smokeless tobacco: Never Used  . Alcohol Use: No    Review of Systems  Constitutional: Negative for fever/chills. ENT: Negative for congestion. Cardiovascular: Negative for chest pain. Respiratory: Negative for cough. Gastrointestinal: Positive for nausea and vomiting, diarrhea a few days ago, and abdominal pain.. Genitourinary: History of amenorrhea. Musculoskeletal: No back pain. Skin: Negative for rash. Neurological: Negative for  headache or focal weakness   10-point ROS otherwise negative.  ____________________________________________   PHYSICAL EXAM:  VITAL SIGNS: ED Triage Vitals  Enc Vitals Group     BP 12/26/15 1550 133/84 mmHg     Pulse Rate 12/26/15 1550 71     Resp 12/26/15 1550 18     Temp 12/26/15 1550 99 F (37.2 C)     Temp Source 12/26/15 1550 Oral     SpO2 12/26/15 1550 100 %     Weight 12/26/15 1550 320 lb (145.151 kg)     Height 12/26/15 1550  (1.676 m)     Head Cir --      Peak Flow --      Pain Score --      Pain Loc --      Pain Edu? --      Excl. in GC? --     Constitutional: Alert and oriented. Large body habitus. Well appearing and in no distress. ENT   Head: Normocephalic and atraumatic.   Nose: No congestion/rhinnorhea.   Cardiovascular: Normal rate, regular rhythm, no murmur noted Respiratory:  Normal respiratory effort, no tachypnea.    Breath sounds are clear and equal bilaterally.  Gastrointestinal: Soft, large body habitus. no distention. Mild tenderness in the upper abdomen, equal bilaterally and in the epigastric area. Back: No muscle spasm, no tenderness, no CVA tenderness. Musculoskeletal: No deformity noted. Nontender with normal range of motion in all extremities.  No noted edema. Neurologic:  Communicative. Normal appearing spontaneous movement in all 4 extremities. No gross  focal neurologic deficits are appreciated.  Skin:  Skin is warm, dry. No rash noted. Psychiatric: Mood and affect are normal. Speech and behavior are normal.  ____________________________________________    LABS (pertinent positives/negatives)  Labs Reviewed  COMPREHENSIVE METABOLIC PANEL - Abnormal; Notable for the following:    AST 11 (*)    ALT 8 (*)    All other components within normal limits  CBC - Abnormal; Notable for the following:    Hemoglobin 11.9 (*)    MCV 71.9 (*)    MCH 22.9 (*)    MCHC 31.8 (*)    RDW 17.9 (*)    All other components within normal  limits  URINALYSIS COMPLETEWITH MICROSCOPIC ([facility] ONLY) - Abnormal; Notable for the following:    Color, Urine YELLOW (*)    APPearance CLOUDY (*)    Ketones, ur 1+ (*)    Bacteria, UA RARE (*)    Squamous Epithelial / LPF 6-30 (*)    All other components within normal limits  HCG, QUANTITATIVE, PREGNANCY - Abnormal; Notable for the following:    hCG, Beta Chain, Quant, S 16109 (*)    All other components within normal limits  POCT PREGNANCY, URINE - Abnormal; Notable for the following:    Preg Test, Ur POSITIVE (*)    All other components within normal limits  POCT PREGNANCY, URINE - Abnormal; Notable for the following:    Preg Test, Ur POSITIVE (*)    All other components within normal limits  LIPASE, BLOOD  POC URINE PREG, ED     ____________________________________________   RADIOLOGY  Ultrasound, right upper quadrant: IMPRESSION: 1. Cholelithiasis without indication of cholecystitis. 2. Hepatic steatosis.   Ultrasound, OB IMPRESSION: Dichorionic diamniotic twin pregnancy, with two intrauterine gestational sacs seen. Crown-rump length of 0.7 cm with both twins corresponds to a gestational age of [redacted] weeks 3 days, and reflects an estimated date of delivery of August 16, 2016.   ____________________________________________   INITIAL IMPRESSION / ASSESSMENT AND PLAN / ED COURSE  Pertinent labs & imaging results that were available during my care of the patient were reviewed by me and considered in my medical decision making (see chart for details).  30 year old female with upper abdominal pain with nausea and vomiting. She has some diarrhea the other day. This sounds mostly gastrointestinal. She does have a history of cholelithiasis. We will check the gallbladder to be sure that she does not have cholecystitis by ultrasound.  Patient does have a positive pregnancy test. This was apprised the patient and her husband, as she has not had a period since 2013. Given  her large body habitus and amenorrhea, it is difficult to assess the gestational age. We will obtain an ultrasound to evaluate the pregnancy.  ----------------------------------------- 8:12 PM on 12/26/2015 -----------------------------------------  Ultrasound right upper upper quadrant shows cholelithiasis without sign of cholecystitis.  HCG is 75,217. OB ultrasound is pending.   ----------------------------------------- 10:04 PM on 12/26/2015 -----------------------------------------  Ultrasound shows a twin gestation at approximately 6 weeks and 3 days.  I informed the patient and her husband of this finding.    Patient reports she still has some nausea. We will treat her with Reglan and begin the PO challenge. If this is tolerated, we'll discharge her home with a procedure for Phenergan and Reglan.  ____________________________________________   FINAL CLINICAL IMPRESSION(S) / ED DIAGNOSES  Final diagnoses:  Twin gestation in first trimester  Non-intractable vomiting with nausea, vomiting of unspecified type  Calculus of gallbladder without cholecystitis without  obstruction      Darien Ramus, MD 12/26/15 2216

## 2016-03-07 ENCOUNTER — Emergency Department
Admission: EM | Admit: 2016-03-07 | Discharge: 2016-03-07 | Disposition: A | Discharge status: Home / Self Care | Payer: Self-pay | Attending: Emergency Medicine | Admitting: Emergency Medicine

## 2016-03-07 ENCOUNTER — Emergency Department: Payer: Self-pay

## 2016-03-07 DIAGNOSIS — O364XX0 Maternal care for intrauterine death, not applicable or unspecified: Secondary | ICD-10-CM | POA: Insufficient documentation

## 2016-03-07 DIAGNOSIS — Z3A17 17 weeks gestation of pregnancy: Secondary | ICD-10-CM | POA: Insufficient documentation

## 2016-03-07 DIAGNOSIS — Z87891 Personal history of nicotine dependence: Secondary | ICD-10-CM | POA: Insufficient documentation

## 2016-03-07 DIAGNOSIS — Z79899 Other long term (current) drug therapy: Secondary | ICD-10-CM | POA: Insufficient documentation

## 2016-03-07 DIAGNOSIS — O209 Hemorrhage in early pregnancy, unspecified: Secondary | ICD-10-CM

## 2016-03-07 DIAGNOSIS — O30002 Twin pregnancy, unspecified number of placenta and unspecified number of amniotic sacs, second trimester: Secondary | ICD-10-CM

## 2016-03-07 DIAGNOSIS — IMO0002 Reserved for concepts with insufficient information to code with codable children: Secondary | ICD-10-CM

## 2016-03-07 DIAGNOSIS — R1030 Lower abdominal pain, unspecified: Secondary | ICD-10-CM | POA: Insufficient documentation

## 2016-03-07 LAB — BASIC METABOLIC PANEL
ANION GAP: 5 (ref 5–15)
BUN: 6 mg/dL (ref 6–20)
CALCIUM: 8.8 mg/dL — AB (ref 8.9–10.3)
CO2: 22 mmol/L (ref 22–32)
Chloride: 106 mmol/L (ref 101–111)
Creatinine, Ser: 0.61 mg/dL (ref 0.44–1.00)
GFR calc Af Amer: >60 mL/min (ref >60)
GFR calc non Af Amer: >60 mL/min (ref >60)
GLUCOSE: 90 mg/dL (ref 65–99)
Potassium: 3.4 mmol/L — ABNORMAL LOW (ref 3.5–5.1)
Sodium: 133 mmol/L — ABNORMAL LOW (ref 135–145)

## 2016-03-07 LAB — TYPE AND SCREEN
ABO/RH(D): O POS
ANTIBODY SCREEN: NEGATIVE

## 2016-03-07 LAB — CBC
HEMATOCRIT: 34.6 % — AB (ref 35.0–47.0)
Hemoglobin: 11.2 g/dL — ABNORMAL LOW (ref 12.0–16.0)
MCH: 24.5 pg — AB (ref 26.0–34.0)
MCHC: 32.5 g/dL (ref 32.0–36.0)
MCV: 75.5 fL — AB (ref 80.0–100.0)
Platelets: 192 10*3/uL (ref 150–440)
RBC: 4.58 MIL/uL (ref 3.80–5.20)
RDW: 17.7 % — AB (ref 11.5–14.5)
WBC: 6.2 10*3/uL (ref 3.6–11.0)

## 2016-03-07 LAB — HCG, QUANTITATIVE, PREGNANCY: HCG, BETA CHAIN, QUANT, S: 35339 m[IU]/mL — AB (ref <5)

## 2016-03-07 LAB — WET PREP, GENITAL
CLUE CELLS WET PREP: NONE SEEN
Trich, Wet Prep: NONE SEEN
Yeast Wet Prep HPF POC: NONE SEEN

## 2016-03-07 LAB — CHLAMYDIA/NGC RT PCR (ARMC ONLY)
CHLAMYDIA TR: NOT DETECTED
N GONORRHOEAE: NOT DETECTED

## 2016-03-07 LAB — ABO/RH: ABO/RH(D): O POS

## 2016-03-07 MED ORDER — FENTANYL CITRATE (PF) 100 MCG/2ML IJ SOLN
50.0000 ug | Freq: Once | INTRAMUSCULAR | Status: AC
  Administered 2016-03-07: 50 ug via INTRAVENOUS

## 2016-03-07 MED ORDER — POTASSIUM CHLORIDE CRYS ER 20 MEQ PO TBCR
40.0000 meq | EXTENDED_RELEASE_TABLET | Freq: Once | ORAL | Status: AC
  Administered 2016-03-07: 40 meq via ORAL
  Filled 2016-03-07: qty 2

## 2016-03-07 MED ORDER — FENTANYL CITRATE (PF) 100 MCG/2ML IJ SOLN
INTRAMUSCULAR | Status: AC
  Administered 2016-03-07: 50 ug via INTRAVENOUS
  Filled 2016-03-07: qty 2

## 2016-03-07 MED ORDER — ACETAMINOPHEN 325 MG PO TABS
650.0000 mg | ORAL_TABLET | Freq: Once | ORAL | Status: AC
  Administered 2016-03-07: 650 mg via ORAL
  Filled 2016-03-07: qty 2

## 2016-03-07 MED ORDER — ONDANSETRON HCL 4 MG/2ML IJ SOLN
4.0000 mg | Freq: Once | INTRAMUSCULAR | Status: AC
  Administered 2016-03-07: 4 mg via INTRAVENOUS

## 2016-03-07 MED ORDER — PRENATAL VITAMIN 27-0.8 MG PO TABS: 1.0000 | ORAL_TABLET | Freq: Every day | ORAL | Status: DC

## 2016-03-07 MED ORDER — ONDANSETRON HCL 4 MG/2ML IJ SOLN
INTRAMUSCULAR | Status: AC
  Administered 2016-03-07: 4 mg via INTRAVENOUS
  Filled 2016-03-07: qty 2

## 2016-03-07 NOTE — ED Provider Notes (Signed)
[facility] Emergency Department Provider Note  ____________________________________________  Time seen: Approximately 7:58 PM  I have reviewed the triage vital signs and the nursing notes.   HISTORY  Chief Complaint Abdominal Pain and Vaginal Bleeding    HPI Mary Hensley is a 30 y.o. female G4 P2 approximately [redacted] weeks pregnant by date but not yet established prenatal care presenting with lower abdominal pain and cramping and vaginal spotting. The patient reports that she was having some severe lower abdominal pain with vaginal spotting. No change in vaginal discharge. No lightheadedness or syncope.     History reviewed. No pertinent past medical history.  There are no active problems to display for this patient.   History reviewed. No pertinent past surgical history.  Current Outpatient Rx  Name  Route  Sig  Dispense  Refill  . metoCLOPramide (REGLAN) 5 MG tablet   Oral   Take 1 tablet (5 mg total) by mouth 3 (three) times daily.   15 tablet   0   . Prenatal Vit-Fe Fumarate-FA (PRENATAL VITAMIN) 27-0.8 MG TABS   Oral   Take 1 tablet by mouth daily.   30 tablet   0   . promethazine (PHENERGAN) 12.5 MG tablet   Oral   Take 1 tablet (12.5 mg total) by mouth every 6 (six) hours as needed for nausea or vomiting.   12 tablet   1   . promethazine (PHENERGAN) 25 MG suppository   Rectal   Place 1 suppository (25 mg total) rectally every 6 (six) hours as needed for nausea.   12 suppository   1     Allergies Penicillins  No family history on file.  Social History Social History  Substance Use Topics  . Smoking status: Former Smoker    Quit date: 11/27/2015  . Smokeless tobacco: Never Used  . Alcohol Use: No    Review of Systems Constitutional: No fever/chills. Eyes: No visual changes. ENT: No sore throat. No congestion or rhinorrhea. Cardiovascular: Denies chest pain. Denies palpitations. Respiratory: Denies shortness of  breath.  No cough. Gastrointestinal: Positive abdominal pain.  No nausea, no vomiting.  No diarrhea.  No constipation. Genitourinary: Negative for dysuria. Positive vaginal spotting. Musculoskeletal: Negative for back pain. Skin: Negative for rash. Neurological: Negative for headaches. No focal numbness, tingling or weakness.   10-point ROS otherwise negative.  ____________________________________________   PHYSICAL EXAM:  VITAL SIGNS: ED Triage Vitals  Enc Vitals Group     BP 03/07/16 1723 136/84 mmHg     Pulse Rate 03/07/16 1723 87     Resp 03/07/16 1723 18     Temp 03/07/16 1723 98.6 F (37 C)     Temp Source 03/07/16 1723 Oral     SpO2 03/07/16 1723 99 %     Weight 03/07/16 1723 350 lb (158.759 kg)     Height 03/07/16 1723 '5\' 6"'  (1.676 m)     Head Cir --      Peak Flow --      Pain Score 03/07/16 1724 8     Pain Loc --      Pain Edu? --      Excl. in [place]? --     Constitutional: Alert and oriented. Well appearing and in no acute distress. Answers questions appropriately. Eyes: Conjunctivae are normal.  EOMI. No scleral icterus. Head: Atraumatic. Nose: No congestion/rhinnorhea. Mouth/Throat: Mucous membranes are moist.  Neck: No stridor.  Supple.   Cardiovascular: Normal rate, regular rhythm. No murmurs, rubs or  gallops.  Respiratory: Normal respiratory effort.  No accessory muscle use or retractions. Lungs CTAB.  No wheezes, rales or ronchi. Gastrointestinal: Morbidly obese. Soft and nondistended.  Mild tenderness to palpation in the suprapubic area. No guarding or rebound.  No peritoneal signs. Genitourinary: Normal-appearing external genitalia without lesions. Vaginal exam with thin white malodorous discharge,, normal-appearing cervix that is partially obstructed with redundant vaginal tissue, normal vaginal wall tissue. Bimanual exam is negative for CMT, but the patient does have mild tenderness to palpation in the right and the left.  Due to her morbid obesity, I am  unable to insert my finger into her cervix and cannot comment on whether it is opened or closed.  No vaginal bleeding at this time. Musculoskeletal: No LE edema. No ttp in the calves or palpable cords.  Negative Homan's sign. Neurologic:  A&Ox3.  Speech is clear.  Face and smile are symmetric.  EOMI.  Moves all extremities well. Skin:  Skin is warm, dry and intact. No rash noted. Psychiatric: Mood and affect are normal. Speech and behavior are normal.  Normal judgement.  ____________________________________________   LABS (all labs ordered are listed, but only abnormal results are displayed)  Labs Reviewed  HCG, QUANTITATIVE, PREGNANCY - Abnormal; Notable for the following:    hCG, Beta Chain, Quant, S [id number] (*)    All other components within normal limits  CBC - Abnormal; Notable for the following:    Hemoglobin 11.2 (*)    HCT 34.6 (*)    MCV 75.5 (*)    MCH 24.5 (*)    RDW 17.7 (*)    All other components within normal limits  BASIC METABOLIC PANEL - Abnormal; Notable for the following:    Sodium 133 (*)    Potassium 3.4 (*)    Calcium 8.8 (*)    All other components within normal limits  WET PREP, GENITAL  CHLAMYDIA/NGC RT PCR ([facility] ONLY)  URINALYSIS COMPLETEWITH MICROSCOPIC ([facility] ONLY)  TYPE AND SCREEN  ABO/RH   ____________________________________________  EKG  Not indicated ____________________________________________  RADIOLOGY  US Ob Limited  03/07/2016  CLINICAL DATA:  Pregnant patient with diffuse abdominal pain and vaginal bleeding for 1 day. EXAM: LIMITED OBSTETRIC ULTRASOUND FINDINGS: Number of Fetuses:  2 FETUS A Heart Rate:  143 beats per minute Movement: Visualized. Presentation: Cephalic. Placental Location: Anterior. Previa: None. Amniotic Fluid (Subjective):  Within normal limits. FL:  2.35cm 17w  0d FETUS B Heart Rate:  Not detected. Movement: Not visualized. Placental Location: Anterior and diminutive. Previa: None. Amniotic Fluid  (Subjective):  Within normal limits. CRL:  2.05cm 8w  5d MATERNAL FINDINGS: Cervix:  Appears closed. Uterus/Adnexae:  No abnormality visualized. IMPRESSION: Twin intrauterine pregnancy. One of the embryos is normal in appearance. Findings consistent with demise of the second embryo are described above. This exam is performed on an emergent basis and does not comprehensively evaluate fetal size, dating, or anatomy; follow-up complete OB US should be considered if further fetal assessment is warranted. Electronically Signed   By: Inge Rise M.D.   On: 03/07/2016 18:46    ____________________________________________   PROCEDURES  Procedure(s) performed: None  Critical Care performed: No ____________________________________________   INITIAL IMPRESSION / ASSESSMENT AND PLAN / ED COURSE  Pertinent labs & imaging results that were available during my care of the patient were reviewed by me and considered in my medical decision making (see chart for details).  30 y.o. G4 P to approximate [redacted] weeks pregnant presenting with vaginal spotting and severe lower  abdominal pain. The patient's vital signs are reassuring and her exam shows some minimal tenderness over the suprapubic area. From triage, she has a positive pregnancy test with an hCG of [address]. She had an ultrasound from triage which shows a twin pregnancy with one twin dated at approximate 17 weeks with fetal heart tones of 143, and a second twin with no fetal heart tones, no movement, and crown to rump length measuring 8 weeks 5 days.  The patient has not established any obstetrics care in this pregnancy. She states that HER-2 prior pregnancies were singleton's that were carried to term without any complications of pregnancy. She is high risk due to her morbid obesity, and pregnancy, now twin fetal loss. No plan to do a pelvic exam, basic labs, STI testing, and to discuss her case with the OB/GYN on  call.  ----------------------------------------- 9:45 PM on 03/07/2016 -----------------------------------------  Patient is Rh-.   ----------------------------------------- 10:00 PM on 03/07/2016 -----------------------------------------  After the patient's vaginal examination, I am concerned that she has no infection. At this time, the patient is unwilling to wait for her wet prep results, she is to go pick her kids.  I have encouraged her to follow up the results and she understands that untreated infection can lead to miscarriage or other problems in the pregnancy.  I have also spoken with Dr. Star Age, the OB/GYN on call. He recommends follow-up this week. This patient was acting seen here in January, and diagnosed with an pregnancy at that time, but has not yet established care. I have reiterated the importance of prenatal care for this patient.  ____________________________________________  FINAL CLINICAL IMPRESSION(S) / ED DIAGNOSES  Final diagnoses:  Fetal demise  Twin pregnancy in second trimester  Vaginal bleeding before [redacted] weeks gestation  Lower abdominal pain      NEW MEDICATIONS STARTED DURING THIS VISIT:  New Prescriptions   PRENATAL VIT-FE FUMARATE-FA (PRENATAL VITAMIN) 27-0.8 MG TABS    Take 1 tablet by mouth daily.     Eula Listen, MD 03/07/16 2202

## 2016-03-07 NOTE — Discharge Instructions (Signed)
Please make sure that you follow up for the results of your vaginal cultures because I'm very suspicious that you have an infection, and untreated infections can lead to miscarriage.  Please make an appointment with Dr. Bonney AidStaebler, the OB/GYN on call.  Please return to the emergency department if you develop severe pain, fever, vaginal bleeding, inability to keep down fluids, or any other symptoms concerning to you.

## 2016-03-07 NOTE — ED Notes (Signed)
Pt states she is approximately [redacted] weeks pregnant with no prenatal care, states she found out she was pregnant when she came here.. Pt c/o lower abd pain with some spotting today..Marland Kitchen

## 2016-05-13 ENCOUNTER — Encounter: Payer: Self-pay | Admitting: Certified Nurse Midwife

## 2016-05-13 ENCOUNTER — Observation Stay
Admission: RE | Admit: 2016-05-13 | Discharge: 2016-05-15 | Disposition: A | Discharge status: Home / Self Care | Payer: Medicaid Other | Attending: Certified Nurse Midwife | Admitting: Certified Nurse Midwife

## 2016-05-13 DIAGNOSIS — Z590 Homelessness: Secondary | ICD-10-CM | POA: Insufficient documentation

## 2016-05-13 DIAGNOSIS — O99282 Endocrine, nutritional and metabolic diseases complicating pregnancy, second trimester: Secondary | ICD-10-CM | POA: Insufficient documentation

## 2016-05-13 DIAGNOSIS — K529 Noninfective gastroenteritis and colitis, unspecified: Secondary | ICD-10-CM | POA: Diagnosis present

## 2016-05-13 DIAGNOSIS — R109 Unspecified abdominal pain: Secondary | ICD-10-CM

## 2016-05-13 DIAGNOSIS — O99212 Obesity complicating pregnancy, second trimester: Secondary | ICD-10-CM | POA: Insufficient documentation

## 2016-05-13 DIAGNOSIS — O99612 Diseases of the digestive system complicating pregnancy, second trimester: Secondary | ICD-10-CM | POA: Diagnosis not present

## 2016-05-13 DIAGNOSIS — E876 Hypokalemia: Secondary | ICD-10-CM | POA: Diagnosis not present

## 2016-05-13 DIAGNOSIS — O26892 Other specified pregnancy related conditions, second trimester: Secondary | ICD-10-CM | POA: Insufficient documentation

## 2016-05-13 DIAGNOSIS — Z880 Allergy status to penicillin: Secondary | ICD-10-CM | POA: Diagnosis not present

## 2016-05-13 DIAGNOSIS — Z87891 Personal history of nicotine dependence: Secondary | ICD-10-CM | POA: Insufficient documentation

## 2016-05-13 DIAGNOSIS — Z3A26 26 weeks gestation of pregnancy: Secondary | ICD-10-CM | POA: Insufficient documentation

## 2016-05-13 DIAGNOSIS — O26899 Other specified pregnancy related conditions, unspecified trimester: Secondary | ICD-10-CM

## 2016-05-13 HISTORY — DX: Calculus of gallbladder without cholecystitis without obstruction: K80.20

## 2016-05-13 HISTORY — DX: Morbid (severe) obesity due to excess calories: E66.01

## 2016-05-13 HISTORY — DX: Essential (primary) hypertension: I10

## 2016-05-13 LAB — COMPREHENSIVE METABOLIC PANEL
ALT: 8 U/L — ABNORMAL LOW (ref 14–54)
ANION GAP: 9 (ref 5–15)
AST: 12 U/L — ABNORMAL LOW (ref 15–41)
Albumin: 3 g/dL — ABNORMAL LOW (ref 3.5–5.0)
Alkaline Phosphatase: 75 U/L (ref 38–126)
BILIRUBIN TOTAL: 0.1 mg/dL — AB (ref 0.3–1.2)
BUN: 5 mg/dL — ABNORMAL LOW (ref 6–20)
CO2: 20 mmol/L — ABNORMAL LOW (ref 22–32)
Calcium: 8.4 mg/dL — ABNORMAL LOW (ref 8.9–10.3)
Chloride: 107 mmol/L (ref 101–111)
Creatinine, Ser: 0.51 mg/dL (ref 0.44–1.00)
GFR calc Af Amer: >60 mL/min (ref >60)
Glucose, Bld: 71 mg/dL (ref 65–99)
POTASSIUM: 3.1 mmol/L — AB (ref 3.5–5.1)
Sodium: 136 mmol/L (ref 135–145)
TOTAL PROTEIN: 6.8 g/dL (ref 6.5–8.1)

## 2016-05-13 LAB — CBC
HEMATOCRIT: 33 % — AB (ref 35.0–47.0)
Hemoglobin: 10.5 g/dL — ABNORMAL LOW (ref 12.0–16.0)
MCH: 23.3 pg — ABNORMAL LOW (ref 26.0–34.0)
MCHC: 31.9 g/dL — AB (ref 32.0–36.0)
MCV: 73.1 fL — AB (ref 80.0–100.0)
Platelets: 197 10*3/uL (ref 150–440)
RBC: 4.51 MIL/uL (ref 3.80–5.20)
RDW: 16.1 % — AB (ref 11.5–14.5)
WBC: 7 10*3/uL (ref 3.6–11.0)

## 2016-05-13 LAB — URINALYSIS COMPLETE WITH MICROSCOPIC (ARMC ONLY)
BILIRUBIN URINE: NEGATIVE
GLUCOSE, UA: NEGATIVE mg/dL
Hgb urine dipstick: NEGATIVE
Leukocytes, UA: NEGATIVE
Nitrite: NEGATIVE
Protein, ur: 30 mg/dL — AB
SPECIFIC GRAVITY, URINE: 1.02 (ref 1.005–1.030)
pH: 6 (ref 5.0–8.0)

## 2016-05-13 LAB — FETAL FIBRONECTIN: FETAL FIBRONECTIN: POSITIVE — AB

## 2016-05-13 LAB — URINE DRUG SCREEN, QUALITATIVE (ARMC ONLY)
Amphetamines, Ur Screen: NOT DETECTED
BARBITURATES, UR SCREEN: NOT DETECTED
Benzodiazepine, Ur Scrn: NOT DETECTED
CANNABINOID 50 NG, UR ~~LOC~~: POSITIVE — AB
COCAINE METABOLITE, UR ~~LOC~~: NOT DETECTED
MDMA (Ecstasy)Ur Screen: NOT DETECTED
Methadone Scn, Ur: NOT DETECTED
Opiate, Ur Screen: NOT DETECTED
Phencyclidine (PCP) Ur S: NOT DETECTED
TRICYCLIC, UR SCREEN: NOT DETECTED

## 2016-05-13 LAB — TYPE AND SCREEN
ABO/RH(D): O POS
Antibody Screen: NEGATIVE

## 2016-05-13 LAB — CHLAMYDIA/NGC RT PCR (ARMC ONLY)
Chlamydia Tr: NOT DETECTED
N gonorrhoeae: NOT DETECTED

## 2016-05-13 LAB — LIPASE, BLOOD: Lipase: 14 U/L (ref 11–51)

## 2016-05-13 MED ORDER — ONDANSETRON 8 MG PO TBDP: 8.0000 mg | ORAL_TABLET | Freq: Once | ORAL | Status: DC

## 2016-05-13 MED ORDER — SODIUM CHLORIDE FLUSH 0.9 % IV SOLN
INTRAVENOUS | Status: AC
  Filled 2016-05-13: qty 10

## 2016-05-13 MED ORDER — KCL-LACTATED RINGERS-D5W 20 MEQ/L IV SOLN
INTRAVENOUS | Status: DC
  Administered 2016-05-14 (×3): via INTRAVENOUS
  Filled 2016-05-13 (×8): qty 1000

## 2016-05-13 MED ORDER — PROMETHAZINE HCL 25 MG PO TABS: 25.0000 mg | ORAL_TABLET | Freq: Four times a day (QID) | ORAL | Status: DC | PRN

## 2016-05-13 MED ORDER — PROMETHAZINE HCL 25 MG RE SUPP
25.0000 mg | Freq: Four times a day (QID) | RECTAL | Status: DC | PRN
  Administered 2016-05-13 – 2016-05-14 (×2): 25 mg via RECTAL
  Filled 2016-05-13 (×4): qty 1

## 2016-05-13 MED ORDER — PROMETHAZINE HCL 25 MG/ML IJ SOLN
INTRAMUSCULAR | Status: AC
  Administered 2016-05-13: 12.5 mg via INTRAVENOUS
  Filled 2016-05-13: qty 1

## 2016-05-13 MED ORDER — PROMETHAZINE HCL 25 MG/ML IJ SOLN
12.5000 mg | Freq: Four times a day (QID) | INTRAMUSCULAR | Status: DC | PRN
  Administered 2016-05-13: 12.5 mg via INTRAVENOUS

## 2016-05-13 MED ORDER — LACTATED RINGERS IV SOLN
INTRAVENOUS | Status: DC
  Administered 2016-05-13: 125 mL/h via INTRAVENOUS

## 2016-05-13 MED ORDER — SODIUM CHLORIDE 0.9 % IJ SOLN
INTRAMUSCULAR | Status: AC
  Administered 2016-05-13: 10 mL
  Filled 2016-05-13: qty 10

## 2016-05-13 MED ORDER — ONDANSETRON HCL 4 MG PO TABS
ORAL_TABLET | ORAL | Status: AC
  Filled 2016-05-13: qty 2

## 2016-05-13 MED ORDER — LACTATED RINGERS IV BOLUS (SEPSIS)
500.0000 mL | Freq: Once | INTRAVENOUS | Status: AC
  Administered 2016-05-13: 500 mL via INTRAVENOUS

## 2016-05-13 MED ORDER — FAMOTIDINE IN NACL 20-0.9 MG/50ML-% IV SOLN
20.0000 mg | Freq: Two times a day (BID) | INTRAVENOUS | Status: DC
  Administered 2016-05-13 – 2016-05-14 (×3): 20 mg via INTRAVENOUS
  Filled 2016-05-13 (×2): qty 50

## 2016-05-13 MED ORDER — POTASSIUM CHLORIDE 10 MEQ/100ML IV SOLN
10.0000 meq | INTRAVENOUS | Status: DC
  Administered 2016-05-13: 10 meq via INTRAVENOUS
  Filled 2016-05-13 (×2): qty 100

## 2016-05-13 MED ORDER — ONDANSETRON 4 MG PO TBDP
ORAL_TABLET | ORAL | Status: AC
  Administered 2016-05-13: 8 mg
  Filled 2016-05-13: qty 2

## 2016-05-13 NOTE — Progress Notes (Addendum)
Progress Note  S: sleepy after phenergan O: Has vomited up mucous x 2 occasions since arrival No contractions seen on monitor IV fluids have been started and she has received phenergan IV Results for orders placed or performed during the hospital encounter of 05/13/16 (from the past 24 hour(s))  CBC     Status: Abnormal   Collection Time: 05/13/16  4:05 PM  Result Value Ref Range   WBC 7.0 3.6 - 11.0 K/uL   RBC 4.51 3.80 - 5.20 MIL/uL   Hemoglobin 10.5 (L) 12.0 - 16.0 g/dL   HCT 16.133.0 (L) 09.635.0 - 04.547.0 %   MCV 73.1 (L) 80.0 - 100.0 fL   MCH 23.3 (L) 26.0 - 34.0 pg   MCHC 31.9 (L) 32.0 - 36.0 g/dL   RDW 40.916.1 (H) 81.111.5 - 91.414.5 %   Platelets 197 150 - 440 K/uL  Comprehensive metabolic panel     Status: Abnormal   Collection Time: 05/13/16  4:05 PM  Result Value Ref Range   Sodium 136 135 - 145 mmol/L   Potassium 3.1 (L) 3.5 - 5.1 mmol/L   Chloride 107 101 - 111 mmol/L   CO2 20 (L) 22 - 32 mmol/L   Glucose, Bld 71 65 - 99 mg/dL   BUN 5 (L) 6 - 20 mg/dL   Creatinine, Ser 7.820.51 0.44 - 1.00 mg/dL   Calcium 8.4 (L) 8.9 - 10.3 mg/dL   Total Protein 6.8 6.5 - 8.1 g/dL   Albumin 3.0 (L) 3.5 - 5.0 g/dL   AST 12 (L) 15 - 41 U/L   ALT 8 (L) 14 - 54 U/L   Alkaline Phosphatase 75 38 - 126 U/L   Total Bilirubin 0.1 (L) 0.3 - 1.2 mg/dL   GFR calc non Af Amer >60 >60 mL/min   GFR calc Af Amer >60 >60 mL/min   Anion gap 9 5 - 15  Type and screen [facility]     Status: None   Collection Time: 05/13/16  4:05 PM  Result Value Ref Range   ABO/RH(D) O POS    Antibody Screen NEG    Sample Expiration 05/16/2016   Urinalysis complete, with microscopic ([facility] only)     Status: Abnormal   Collection Time: 05/13/16  4:30 PM  Result Value Ref Range   Color, Urine YELLOW (A) YELLOW   APPearance HAZY (A) CLEAR   Glucose, UA NEGATIVE NEGATIVE mg/dL   Bilirubin Urine NEGATIVE NEGATIVE   Ketones, ur 2+ (A) NEGATIVE mg/dL   Specific Gravity, Urine 1.020 1.005 - 1.030   Hgb urine  dipstick NEGATIVE NEGATIVE   pH 6.0 5.0 - 8.0   Protein, ur 30 (A) NEGATIVE mg/dL   Nitrite NEGATIVE NEGATIVE   Leukocytes, UA NEGATIVE NEGATIVE   RBC / HPF 0-5 0 - 5 RBC/hpf   WBC, UA 6-30 0 - 5 WBC/hpf   Bacteria, UA RARE (A) NONE SEEN   Squamous Epithelial / LPF 6-30 (A) NONE SEEN   Mucous PRESENT    A: Abdominal cramping/ nausea/ vomiting/ diarrhea probably due to gastroenteritis No evidence of preterm labor Hypokalemia  P: Discontinue external fetal monitors KCL IV piggy back  Arayna Illescas, CNM

## 2016-05-13 NOTE — H&P (Signed)
OB History & Physical   History of Present Illness:  Chief Complaint:  Presents via EMS with complaints of abdominal cramping and spotting that began after having intercourse at 0400 this AM. HPI:  Mary Hensley is a 30 y.o. G1P0 female at 4247w4d dated by a 17 week FL in the ER.  Her pregnancy has been complicated by no prenatal care, obesity, and homelessness.  She presents to L&D for evaluation of lower  abdominal cramping. She also admits to nausea/ vomiting and diarrhea x 2 days. Has had two loose stools today and has vomited twice before her arrival here. No blood in stool. No dysuria. Last able to eat was yesterday AM. No LOF or vulvovaginal itching. Having some heartburn. Has a hx of gallstones, but not having any upper abdominal pain. She is currently homeless living off an exit near a truck stop and motel. Her 2 children live with her mother in [place].    Prenatal care site: No prenatal care. Was seen in ER in April and had an ultrasound at that time which revelaed a twin gestation with one viable twin measuring 17 weeks [facility] and a nonviable twin measuring 8wk5days with no fetal cardiac activity. She is O POS. Patient also states she fell on her bottom 4-5 days ago going down some stairs. No bleeding at that time. Has been feeling her baby move well.      Maternal Medical History:   Past Medical History  Diagnosis Date  . Cholelithiasis   Morbid Obesity  Past Surgical History  Procedure Laterality Date  . No past surgeries      Allergies  Allergen Reactions  . Penicillins Hives    Prior to Admission medications   Not on File          Social History: She  reports that she quit smoking about 5 months ago. She has never used smokeless tobacco. She reports that she uses illicit drugs (Marijuana) about once per week. She reports that she does not drink alcohol.  Family History: family history is not on file.   Review of Systems: see HPI      Physical Exam:  Vital Signs: BP 138/70 mmHg  Pulse 88  Temp(Src) 98.2 F (36.8 C)  SpO2 100% General: no acute distress.  HEENT: normocephalic, atraumatic Heart: regular rate & rhythm with a Grade II-III systolic murmur at the base Lungs: clear to auscultation bilaterally Abdomen: soft, gravid, obese. non-tender upper abdomen. BS active  Pelvic:   External: Normal external female genitalia  Cervix: Dilation: Closed / Effacement (%): Thick / Station:  (OOP)   Extremities: non-tender, symmetric,  Neurologic: Alert & oriented x 3.    Pertinent Results:  Prenatal Labs: Blood type/Rh O positive  Antibody screen negative  Rubella pending  RPR pending  HBsAg negative  HIV negative  GC negative  Chlamydia negative  Genetic screening   1 hour GTT   3 hour GTT   GBS    Baseline FHR: 135BPM Toco: no evidence of contractions Bedside Ultrasound: SIUP in transverse lie with fetal head on maternal right, anterior placenta. Difficult exam due to obesity   Assessment:  Mary Hensley is a 30 y.o. G1P0 female at 1447w4d with lower abdominal cramping, nausea and vomiting, and diarrhea  Probable gastroenteritis  No prenatal care  Homelessness/ no health insurance   Plan:  1. IV fluids and IV antiemetics (some difficulty getting IV started, tried Zofran ODT-but vomited it back up). Will use  phenergan IV or rectal suppositories if needed 2. NOB labs and CMP, UA, UDS, GC/Chlamydia, FFN (should not have been done-had IC within the last 24 hours-results invalid) 3. Observe for contractions 4. Start po when stops vomiting 5.   Farrel Conners  05/13/2016 4:58 PM

## 2016-05-13 NOTE — Plan of Care (Signed)
Report received from LE RN and CG CNM. Pt is [id number] who arrived per EMS for abd cramping, n/v x 2 days. States no bleeding or leaking of fluid and there is active fetal movement noted per pt. Pt states she last ate yesterday. She is a homeless mother staying at a local hotel.  Her mother lives in [place] and has her two children with her. Pt states she fell 4/5 days ago down some stairs but has felt fetal movement since then. Ellison Carwin Jennica Tagliaferri RNC

## 2016-05-14 ENCOUNTER — Encounter: Payer: Self-pay | Admitting: Certified Nurse Midwife

## 2016-05-14 DIAGNOSIS — O26899 Other specified pregnancy related conditions, unspecified trimester: Secondary | ICD-10-CM

## 2016-05-14 DIAGNOSIS — O99612 Diseases of the digestive system complicating pregnancy, second trimester: Secondary | ICD-10-CM | POA: Diagnosis not present

## 2016-05-14 DIAGNOSIS — R109 Unspecified abdominal pain: Secondary | ICD-10-CM

## 2016-05-14 LAB — URINALYSIS COMPLETE WITH MICROSCOPIC (ARMC ONLY)
BILIRUBIN URINE: NEGATIVE
GLUCOSE, UA: NEGATIVE mg/dL
Hgb urine dipstick: NEGATIVE
NITRITE: NEGATIVE
PROTEIN: NEGATIVE mg/dL
SPECIFIC GRAVITY, URINE: 1.015 (ref 1.005–1.030)
pH: 7 (ref 5.0–8.0)

## 2016-05-14 LAB — HEPATITIS B SURFACE ANTIGEN: HEP B S AG: NEGATIVE

## 2016-05-14 LAB — HIV ANTIBODY (ROUTINE TESTING W REFLEX): HIV SCREEN 4TH GENERATION: NONREACTIVE

## 2016-05-14 MED ORDER — ONDANSETRON HCL 4 MG/2ML IJ SOLN
4.0000 mg | INTRAMUSCULAR | Status: DC | PRN
  Administered 2016-05-14 – 2016-05-15 (×6): 4 mg via INTRAVENOUS
  Filled 2016-05-14 (×5): qty 2

## 2016-05-14 MED ORDER — ONDANSETRON HCL 4 MG/2ML IJ SOLN
INTRAMUSCULAR | Status: AC
  Administered 2016-05-14: 4 mg via INTRAVENOUS
  Filled 2016-05-14: qty 2

## 2016-05-14 MED ORDER — LACTATED RINGERS IV SOLN: 500.0000 mL | INTRAVENOUS | Status: DC | PRN

## 2016-05-14 NOTE — Progress Notes (Signed)
Called to room by Pt. And Pt. With C/O bilat. Hip pain and lower abd. Pain.  Also with c/o Nausea. Abd. Is soft, denies contractions.  No Vaginal Pain or discharge. VSS. Color good, skin w&d. Called [facility] to contact  CNM C. Sharen HonesGutierrez. She is in a C-Section at this time and stated she will come and assess the Pt. As soon as the case is completed. I informed Pt. And she states that was fine.

## 2016-05-14 NOTE — Progress Notes (Signed)
C. Sharen HonesGutierrez CNM here to see Pt. When we went in Pt. Room, Pt. Snoring. Will re-notify  CNM if Pt. Pt. Awakens in Pain.

## 2016-05-14 NOTE — Clinical Social Work Note (Signed)
Clinical Social Work Assessment  Patient Details  Name: Mary Hensley MRN: [id number] Date of Birth: 1986/03/28  Date of referral:  05/14/16               Reason for consult:  Discharge Planning                Permission sought to share information with:  Family Supports Permission granted to share information::     Name::        Agency::     Relationship::     Contact Information:     Housing/Transportation Living arrangements for the past 2 months:  Homeless Source of Information:  Patient Patient Interpreter Needed:  None Criminal Activity/Legal Involvement Pertinent to Current Situation/Hospitalization:  No - Comment as needed Significant Relationships:  Spouse Lives with:  Spouse Do you feel safe going back to the place where you live?  Yes Need for family participation in patient care:  No (Coment)  Care giving concerns:  Patient reports that she's homeless and has been living in her [place] with her husband.   Social Worker assessment / plan:  CSW met with patient at bedside. Patient reports that she was recetnly married about 6 months ago. Reported that they live in their [place] and sometimes stay in a room when they have the money. Patient reports that she'  currently out of work and her husband is too. Stated that he fixes computers sometimes and that's how they get money. Stated they were living in her grandmothers house but her grandmother passed and the house was taken away from her. CSW provided patient with homeless shelter information. Per patient they lived at a homeless shelter for two nights and received bed bug bites. Reported she rather not live at a homeless shelter. CSW explored patient's family supports. Per patient her mother lives in [place] and cares for patient's other two children. Per patient she cannot live with her mother because she does not get along with her mother's husband. Reported she has a 32 year old sister but cannot live with her because her  sister lives with her father. Reported that she does not have any other family in the area. Patient reported that she's trying to figured out her living situation. CSW inquired about patient applying thorough the [place]. Per patient she owes the housing authority $1,700. She stated that she cannot get her apartment back. CSW informed patient that if she has the baby and still experiencing homelessness a CPS report will be made. Patient reported that she understood. Reported that she plans to have things figured out by the time she has her child.   Employment status:  Unemployed Forensic scientist:  Self Pay (Medicaid Pending) PT Recommendations:  Not assessed at this time Information / Referral to community resources:  Shelter  Patient/Family's Response to care:  Patient was appreciative to CSW's assistance. Stated that she is going to try to figure out her situation.   Patient/Family's Understanding of and Emotional Response to Diagnosis, Current Treatment, and Prognosis:  Patient understands why she's at [facility] and is worried about her pregnancy.   Emotional Assessment Appearance:  Appears stated age Attitude/Demeanor/Rapport:   (None) Affect (typically observed):  Calm, Pleasant, Accepting Orientation:  Oriented to Self, Oriented to Place, Oriented to  Time, Oriented to Situation Alcohol / Substance use:  Not Applicable Psych involvement (Current and /or in the community):  No (Comment)  Discharge Needs  Concerns to be addressed:  Discharge  Planning Concerns Readmission within the last 30 days:  No Current discharge risk:  Homeless Barriers to Discharge:  No Barriers Identified   [place], LCSW 05/14/2016, 5:31 PM

## 2016-05-15 DIAGNOSIS — O99612 Diseases of the digestive system complicating pregnancy, second trimester: Secondary | ICD-10-CM | POA: Diagnosis not present

## 2016-05-15 LAB — RPR: RPR Ser Ql: NONREACTIVE

## 2016-05-15 LAB — RUBELLA SCREEN: RUBELLA: 3.05 {index} (ref >0.99)

## 2016-05-15 LAB — VARICELLA ZOSTER ANTIBODY, IGG: VARICELLA IGG: 2591 {index} (ref >165)

## 2016-05-15 MED ORDER — ONDANSETRON HCL 4 MG PO TABS: 4.0000 mg | ORAL_TABLET | Freq: Three times a day (TID) | ORAL | Status: DC | PRN

## 2016-05-15 MED ORDER — PRENATAL VITAMIN 27-0.8 MG PO TABS: 1.0000 | ORAL_TABLET | Freq: Every day | ORAL | Status: AC

## 2016-05-15 NOTE — Progress Notes (Signed)
All discharge instructions given to patient and she voiced understanding of all instructions given.  Call made for taxi to come get patient prescriptions given. Waiting on transportation

## 2016-05-15 NOTE — Discharge Summary (Signed)
Physician Discharge Summary  Patient ID: Mary Hensley MRN: [id number] DOB/AGE: [birth year] 30 y.o.  Admit date: 05/13/2016 Discharge date: 05/15/2016  Admission Diagnoses: Pain at 26 weeks pregnancy; in need of prenatal care, history of obesity and chronic hypertension  Discharge Diagnoses:  Active Problems:   Abdominal cramping affecting pregnancy   Discharged Condition: good  Hospital Course: Treated for nausea and vomiting, related to gastroenteritis-type illness.  Encouraged need for prenatal care, and this would need to be thru a high risk facility due to BMI>50.  No HTN concerns this admission.  Pt improving today and able to tolerate po's well.  Consults: None  Significant Diagnostic Studies: see labs  Treatments: IV hydration and Zofran for nausea  Discharge Exam: Blood pressure 138/75, pulse 89, temperature 98.2 F (36.8 C), temperature source Oral, resp. rate 18, SpO2 100 %, unknown if currently breastfeeding. General appearance: alert, cooperative and morbidly obese Cardio: regular rate and rhythm, S1, S2 normal, no murmur, click, rub or gallop GI: soft, non-tender; bowel sounds normal; no masses,  no organomegaly Skin: Skin color, texture, turgor normal. No rashes or lesions  Disposition: 01-Home or Self Care  [facility], need for tertiary care facility     Medication List    TAKE these medications        ondansetron 4 MG tablet  Commonly known as:  ZOFRAN  Take 1 tablet (4 mg total) by mouth every 8 (eight) hours as needed for nausea or vomiting.     Prenatal Vitamin 27-0.8 MG Tabs  Take 1 tablet by mouth daily.         Signed: Letitia LibraRobert Paul Trejon Duford 05/15/2016, 9:26 AM

## 2016-05-15 NOTE — Progress Notes (Signed)
   05/15/16 1002  Clinical Encounter Type  Visited With Patient and family together  Visit Type Initial  Referral From Nurse  Consult/Referral To Chaplain  Spiritual Encounters  Spiritual Needs Prayer  Stress Factors  Patient Stress Factors Health changes  Family Stress Factors None identified  I was referred verbally by the nurse to check on the well-being of the patient.

## 2016-05-15 NOTE — Clinical Social Work Note (Signed)
CSW received call from patient's nurse requesting taxi voucher for patient. CSW met with patient and her significant other this morning and informed that it was the previous CSW's understanding that they lived out of a van. Patient denied this to be true and stated that they do not have any transportation. Due to patient's pregnancy and admission to hospital for complications, CSW will approve a taxi voucher. Patient and significant other stated that they wish to go to the [facility] at [address] in [place] which is where their belongings are. Monica Marra MSW,LCSW [phone] 

## 2016-05-15 NOTE — Plan of Care (Signed)
Problem: Health Behavior/Discharge Planning: Goal: Ability to manage health-related needs will improve Outcome: Progressing Social Service Consult because Pt. Is Homeless.  Problem: Nutrition: Goal: Adequate nutrition will be maintained Outcome: Progressing Tolerating Soft Diet

## 2016-05-15 NOTE — Progress Notes (Signed)
L&D progress Note  S: Slept a little . Nauseous this Am despite antiemetics Abdominal cramping intermittently, sometimes in upper abdomen, sometimes in lower abdomen. No further diarrhea. Vomited several times, mostly mucous vomitus Unable to tolerate IV KCL bags last night Wants to talk with social services regarding getting medicaid O: Blood pressure elevations last night when having abdominal cramping, better this Am 133/66 Afebrile. Pulse in 70s and 80s Intermittent DT-normal FHR Denies contractions Abdomen: obese, diffusely tender, ND, soft, BS active  A: Probable gastroenteritis Probable CHTN as noted on [facility] records from previous pregnancy  P: [facility] Begin po fluids and crackers and advance diet as tolerated Continue IV and antiemetics Social services consult today.  Farrel ConnersGUTIERREZ, Mal[place]ha [place], CNM

## 2016-05-15 NOTE — Discharge Instructions (Signed)
F/u sooner with pain, problems breathing, not keeping down fluids or signs of labor or any other concerns about the baby

## 2016-05-15 NOTE — Progress Notes (Signed)
All discharge instructions given to patient and she voices understanding of all instructions given.  Iv  D/c'd patient tolerated well, site benign. Taxi voucher given to patient and patient discharged  Per md order.

## 2016-05-15 NOTE — Care Management (Signed)
Received referral for patient being homeless. Clinical Social Worker consult. Gwenette GreetBrenda S Pamela Maddy RN MSN CCM Care Management    [phone]

## 2016-05-21 ENCOUNTER — Observation Stay
Admission: EM | Admit: 2016-05-21 | Discharge: 2016-05-23 | Disposition: A | Discharge status: Home / Self Care | Payer: Medicaid Other | Attending: Obstetrics & Gynecology | Admitting: Obstetrics & Gynecology

## 2016-05-21 DIAGNOSIS — O26892 Other specified pregnancy related conditions, second trimester: Secondary | ICD-10-CM | POA: Insufficient documentation

## 2016-05-21 DIAGNOSIS — O99212 Obesity complicating pregnancy, second trimester: Secondary | ICD-10-CM | POA: Diagnosis not present

## 2016-05-21 DIAGNOSIS — Z3A27 27 weeks gestation of pregnancy: Secondary | ICD-10-CM | POA: Diagnosis not present

## 2016-05-21 DIAGNOSIS — O99282 Endocrine, nutritional and metabolic diseases complicating pregnancy, second trimester: Secondary | ICD-10-CM | POA: Insufficient documentation

## 2016-05-21 DIAGNOSIS — Z6841 Body Mass Index (BMI) 40.0 and over, adult: Secondary | ICD-10-CM | POA: Insufficient documentation

## 2016-05-21 DIAGNOSIS — Z87891 Personal history of nicotine dependence: Secondary | ICD-10-CM | POA: Diagnosis not present

## 2016-05-21 DIAGNOSIS — O219 Vomiting of pregnancy, unspecified: Secondary | ICD-10-CM | POA: Diagnosis present

## 2016-05-21 DIAGNOSIS — Z590 Homelessness: Secondary | ICD-10-CM | POA: Insufficient documentation

## 2016-05-21 DIAGNOSIS — Z880 Allergy status to penicillin: Secondary | ICD-10-CM | POA: Insufficient documentation

## 2016-05-21 DIAGNOSIS — E878 Other disorders of electrolyte and fluid balance, not elsewhere classified: Secondary | ICD-10-CM | POA: Diagnosis present

## 2016-05-21 DIAGNOSIS — E876 Hypokalemia: Secondary | ICD-10-CM | POA: Diagnosis not present

## 2016-05-21 DIAGNOSIS — O162 Unspecified maternal hypertension, second trimester: Secondary | ICD-10-CM | POA: Diagnosis not present

## 2016-05-21 DIAGNOSIS — O212 Late vomiting of pregnancy: Secondary | ICD-10-CM | POA: Diagnosis not present

## 2016-05-21 DIAGNOSIS — R079 Chest pain, unspecified: Secondary | ICD-10-CM

## 2016-05-21 DIAGNOSIS — O10919 Unspecified pre-existing hypertension complicating pregnancy, unspecified trimester: Secondary | ICD-10-CM | POA: Diagnosis present

## 2016-05-21 LAB — URINALYSIS COMPLETE WITH MICROSCOPIC (ARMC ONLY)
BILIRUBIN URINE: NEGATIVE
Bacteria, UA: NONE SEEN
GLUCOSE, UA: NEGATIVE mg/dL
Hgb urine dipstick: NEGATIVE
NITRITE: NEGATIVE
Protein, ur: 30 mg/dL — AB
SPECIFIC GRAVITY, URINE: 1.021 (ref 1.005–1.030)
pH: 6 (ref 5.0–8.0)

## 2016-05-21 MED ORDER — DEXTROSE IN LACTATED RINGERS 5 % IV SOLN
INTRAVENOUS | Status: DC
  Administered 2016-05-21 – 2016-05-22 (×2): via INTRAVENOUS

## 2016-05-21 MED ORDER — PROMETHAZINE HCL 25 MG RE SUPP
25.0000 mg | RECTAL | Status: DC | PRN
  Administered 2016-05-21: 25 mg via RECTAL
  Filled 2016-05-21 (×2): qty 1

## 2016-05-21 MED ORDER — ACETAMINOPHEN 325 MG PO TABS
650.0000 mg | ORAL_TABLET | ORAL | Status: DC | PRN
  Administered 2016-05-22 (×2): 650 mg via ORAL
  Filled 2016-05-21 (×2): qty 2

## 2016-05-21 MED ORDER — DEXTROSE 5 % IN LACTATED RINGERS IV BOLUS
1000.0000 mL | Freq: Once | INTRAVENOUS | Status: AC
  Administered 2016-05-21: 1000 mL via INTRAVENOUS

## 2016-05-21 MED ORDER — PROCHLORPERAZINE EDISYLATE 5 MG/ML IJ SOLN
10.0000 mg | INTRAMUSCULAR | Status: DC | PRN
  Administered 2016-05-22: 10 mg via INTRAVENOUS
  Filled 2016-05-21 (×2): qty 2

## 2016-05-21 MED ORDER — ONDANSETRON HCL 4 MG/2ML IJ SOLN: 4.0000 mg | Freq: Four times a day (QID) | INTRAMUSCULAR | Status: DC | PRN

## 2016-05-21 NOTE — Plan of Care (Signed)
Blood pressure taken with large bp cuff in lower right arm

## 2016-05-21 NOTE — H&P (Signed)
OB History & Physical   History of Present Illness:  Chief Complaint:  Presents with complaints of abdominal cramping and nausea/vomiting this week worse today; also has cramping that began after having intercourse today. HPI:  Mary Hensley Hensley is a 30 y.o. G1P0 female at 1849w4d dated by a 17 week US.  Her pregnancy has been complicated by no prenatal care, obesity, and homelessness.  She presents to L&D for evaluation of n/v and lower  abdominal cramping. She also admits to nausea/ vomiting.  No dysuria. Last able to eat was yesterday AM. No LOF or vulvovaginal itching. Having some heartburn. Has a hx of gallstones, but not having any upper abdominal pain. She is currently homeless living off an exit near a truck stop and motel. Her 2 children live with her mother in [place].    Prenatal care site: No prenatal care. Was seen in ER in April and last week; and had an ultrasound at that time which revelaed a twin gestation with one viable twin measuring 17 weeks according to [facility] and a nonviable twin measuring 8wk5days with no fetal cardiac activity. She is O POS. Patient also states she fell on her bottom 4-5 days ago going down some stairs. No bleeding at that time. Has been feeling her baby move well.      Maternal Medical History:   Past Medical History  Diagnosis Date  . Cholelithiasis   . Chronic hypertension   . Morbid obesity (HCC)   Morbid Obesity  Past Surgical History  Procedure Laterality Date  . No past surgeries      Allergies  Allergen Reactions  . Penicillins Hives   Prior to Admission medications   Not on File   Social History: She  reports that she quit smoking about 5 months ago. She has never used smokeless tobacco. She reports that she uses illicit drugs (Marijuana) about once per week. She reports that she does not drink alcohol.  Family History: family history is not on file.   Review of Systems: see HPI   Physical Exam:  Vital Signs: BP 100/76 mmHg   Pulse 88  Temp(Src) 98.6 F (37 C)  SpO2 100% General: no acute distress.  HEENT: normocephalic, atraumatic Heart: regular rate & rhythm with a Grade II-III systolic murmur at the base Lungs: clear to auscultation bilaterally Abdomen: soft, gravid, obese. non-tender upper abdomen. BS active  Pelvic:  External: Normal external female genitalia  Cervix: Dilation: Closed /   /     Extremities: non-tender, symmetric,  Neurologic: Alert & oriented x 3.    Pertinent Results:  Prenatal Labs: Blood type/Rh O positive  Antibody screen negative  Rubella pending  RPR pending  HBsAg negative  HIV negative  GC negative  Chlamydia negative               Baseline FHR: 135BPM Toco: no evidence of contractions   Assessment:  Mary Hensley Hensley is a 30 y.o. G1P0 female at 8849w4d with lower abdominal cramping, nausea and vomiting  Gastroenteritis vs nausea/vomiting of pregnancy and difficult home situation, poor diet  No prenatal care  Homelessness/ no health insurance   Plan:  1. IV fluids and IV antiemetics (some difficulty getting IV started, tried Zofran ODT-but vomited it back up). Will use phenergan IV or rectal suppositories if needed 2. Observe for contractions 3. Start po when stops vomiting   Mary Hensley Hensley  05/21/2016 10:20 PM

## 2016-05-21 NOTE — Plan of Care (Signed)
Clean catch urine sent to lab. Urine amber color

## 2016-05-21 NOTE — Plan of Care (Signed)
Unable to keep continuous monitoring due to habitus.

## 2016-05-21 NOTE — Plan of Care (Signed)
Spoke to dr Tiburcio Peaharris. Urine results given . Order to po hydrate pt, try feeding pt.

## 2016-05-21 NOTE — Plan of Care (Signed)
Nurse questioned pt if she had had intercourse in the last 24 hours, pt stated yes she had. Pt has not vomited since she arrived to [facility]. Offered pt ginger ale. Pt refused.

## 2016-05-21 NOTE — Plan of Care (Signed)
Pt presents to l/d with c/o nausea/vomiting and lower abdominal pain since around 11am today.

## 2016-05-21 NOTE — Plan of Care (Signed)
Pt states the taste of po zofran makes her sick. When i questioned pt about attempting to obtain housing, she states she has had housing before. It's not that hard to get. Earlier, i thought pt had made reference to a social worker attempting to help her find housing but pt stated that she and her friend were trying to get housing on their own.

## 2016-05-21 NOTE — Plan of Care (Signed)
Pt states she had a sweet potato pie and a butter biscuit from [employer] yester, she had half chicken sandwich today

## 2016-05-22 ENCOUNTER — Other Ambulatory Visit: Payer: Self-pay

## 2016-05-22 ENCOUNTER — Encounter: Payer: Self-pay | Admitting: *Deleted

## 2016-05-22 ENCOUNTER — Observation Stay
Admit: 2016-05-22 | Discharge: 2016-05-22 | Disposition: A | Discharge status: Home / Self Care | Payer: Medicaid Other | Attending: Certified Nurse Midwife | Admitting: Certified Nurse Midwife

## 2016-05-22 ENCOUNTER — Encounter: Payer: Self-pay | Admitting: Certified Nurse Midwife

## 2016-05-22 ENCOUNTER — Observation Stay: Payer: Medicaid Other

## 2016-05-22 ENCOUNTER — Observation Stay (HOSPITAL_BASED_OUTPATIENT_CLINIC_OR_DEPARTMENT_OTHER)
Admit: 2016-05-22 | Discharge: 2016-05-22 | Disposition: A | Discharge status: Home / Self Care | Payer: Medicaid Other | Attending: Certified Nurse Midwife | Admitting: Certified Nurse Midwife

## 2016-05-22 DIAGNOSIS — O0930 Supervision of pregnancy with insufficient antenatal care, unspecified trimester: Secondary | ICD-10-CM | POA: Insufficient documentation

## 2016-05-22 DIAGNOSIS — O10919 Unspecified pre-existing hypertension complicating pregnancy, unspecified trimester: Secondary | ICD-10-CM | POA: Diagnosis present

## 2016-05-22 DIAGNOSIS — Z3A27 27 weeks gestation of pregnancy: Secondary | ICD-10-CM

## 2016-05-22 DIAGNOSIS — O10912 Unspecified pre-existing hypertension complicating pregnancy, second trimester: Secondary | ICD-10-CM

## 2016-05-22 DIAGNOSIS — O99212 Obesity complicating pregnancy, second trimester: Secondary | ICD-10-CM

## 2016-05-22 DIAGNOSIS — Z5900 Homelessness unspecified: Secondary | ICD-10-CM

## 2016-05-22 DIAGNOSIS — O219 Vomiting of pregnancy, unspecified: Secondary | ICD-10-CM

## 2016-05-22 DIAGNOSIS — O9989 Other specified diseases and conditions complicating pregnancy, childbirth and the puerperium: Secondary | ICD-10-CM

## 2016-05-22 DIAGNOSIS — D649 Anemia, unspecified: Secondary | ICD-10-CM

## 2016-05-22 DIAGNOSIS — O162 Unspecified maternal hypertension, second trimester: Secondary | ICD-10-CM | POA: Diagnosis not present

## 2016-05-22 DIAGNOSIS — O26899 Other specified pregnancy related conditions, unspecified trimester: Secondary | ICD-10-CM

## 2016-05-22 DIAGNOSIS — R109 Unspecified abdominal pain: Secondary | ICD-10-CM

## 2016-05-22 LAB — CBC
HCT: 28.5 % — ABNORMAL LOW (ref 35.0–47.0)
HEMOGLOBIN: 9 g/dL — AB (ref 12.0–16.0)
MCH: 23.7 pg — AB (ref 26.0–34.0)
MCHC: 31.8 g/dL — ABNORMAL LOW (ref 32.0–36.0)
MCV: 74.7 fL — AB (ref 80.0–100.0)
Platelets: 172 10*3/uL (ref 150–440)
RBC: 3.81 MIL/uL (ref 3.80–5.20)
RDW: 16.5 % — ABNORMAL HIGH (ref 11.5–14.5)
WBC: 5.1 10*3/uL (ref 3.6–11.0)

## 2016-05-22 LAB — COMPREHENSIVE METABOLIC PANEL
ALT: <5 U/L — ABNORMAL LOW (ref 14–54)
ANION GAP: 6 (ref 5–15)
AST: 11 U/L — ABNORMAL LOW (ref 15–41)
Albumin: 2.4 g/dL — ABNORMAL LOW (ref 3.5–5.0)
Alkaline Phosphatase: 65 U/L (ref 38–126)
BUN: <5 mg/dL — ABNORMAL LOW (ref 6–20)
CHLORIDE: 108 mmol/L (ref 101–111)
CO2: 22 mmol/L (ref 22–32)
Calcium: 8 mg/dL — ABNORMAL LOW (ref 8.9–10.3)
Creatinine, Ser: 0.34 mg/dL — ABNORMAL LOW (ref 0.44–1.00)
Glucose, Bld: 95 mg/dL (ref 65–99)
Potassium: 2.8 mmol/L — CL (ref 3.5–5.1)
SODIUM: 136 mmol/L (ref 135–145)
Total Bilirubin: 0.4 mg/dL (ref 0.3–1.2)
Total Protein: 5.5 g/dL — ABNORMAL LOW (ref 6.5–8.1)

## 2016-05-22 LAB — TYPE AND SCREEN
ABO/RH(D): O POS
ANTIBODY SCREEN: NEGATIVE

## 2016-05-22 MED ORDER — PYRIDOXINE HCL 25 MG PO TABS
25.0000 mg | ORAL_TABLET | Freq: Three times a day (TID) | ORAL | Status: DC
  Administered 2016-05-22 – 2016-05-23 (×2): 25 mg via ORAL
  Filled 2016-05-22 (×5): qty 1

## 2016-05-22 MED ORDER — FAMOTIDINE 20 MG PO TABS
20.0000 mg | ORAL_TABLET | Freq: Two times a day (BID) | ORAL | Status: DC
  Administered 2016-05-22 – 2016-05-23 (×2): 20 mg via ORAL
  Filled 2016-05-22 (×2): qty 1

## 2016-05-22 MED ORDER — SODIUM CHLORIDE 0.9 % IJ SOLN
INTRAMUSCULAR | Status: AC
  Filled 2016-05-22: qty 10

## 2016-05-22 MED ORDER — POTASSIUM CHLORIDE CRYS ER 20 MEQ PO TBCR
20.0000 meq | EXTENDED_RELEASE_TABLET | Freq: Two times a day (BID) | ORAL | Status: DC
  Administered 2016-05-22 – 2016-05-23 (×2): 20 meq via ORAL
  Filled 2016-05-22 (×2): qty 1

## 2016-05-22 MED ORDER — KCL-LACTATED RINGERS-D5W 20 MEQ/L IV SOLN
INTRAVENOUS | Status: DC
  Administered 2016-05-22: 10:00:00 via INTRAVENOUS
  Filled 2016-05-22 (×4): qty 1000

## 2016-05-22 MED ORDER — FAMOTIDINE IN NACL 20-0.9 MG/50ML-% IV SOLN
20.0000 mg | Freq: Two times a day (BID) | INTRAVENOUS | Status: DC
  Filled 2016-05-22: qty 50

## 2016-05-22 MED ORDER — PRENATAL MULTIVITAMIN CH
1.0000 | ORAL_TABLET | Freq: Every day | ORAL | Status: DC
  Administered 2016-05-23: 1 via ORAL
  Filled 2016-05-22: qty 1

## 2016-05-22 MED ORDER — PROMETHAZINE HCL 25 MG PO TABS
25.0000 mg | ORAL_TABLET | Freq: Four times a day (QID) | ORAL | Status: DC | PRN
  Administered 2016-05-23: 25 mg via ORAL
  Filled 2016-05-22: qty 1

## 2016-05-22 NOTE — Progress Notes (Signed)
[facility] Consultation   Chief Complaint:   HPI: [name] is a 30 y.o. [id number] at 1813w4d by 2835w4d ultrasond who presents in consultation from  [name] CNM due to morbid obesity and abdominal pain.  The patient has not had prenatal care, but has had two ultrasounds performed in the ED.  She had a 1535w4d US performed through the ED at [facility] on 12/26/2015.  At that time, a diamniotic/dichorionic gestation was noted.  The patient had  a subsequent US performed [facility] the [facility] at [facility] on 03/07/2016 at  6761w0d gestation.  A demise of twin B was noted.  CRL of the demise was consistent with an 5753w5d embryo.    The patient said that this most recent bout of abdominal pain has been accompanied by vomiting. She has vomited 4 times since she was admitted 2 days ago. She reports "diarrhea" with only 2-3 bowel movements, however, per day. She denies any bloody stools. The abdominal pain is in her lower abdomen on both sides and extends to her back. She states that she is short of breath "all the time".  She reports that periodically she has episodes of sharp chest pain that last for 15 seconds.    Obstetric History:  Obstetric History   G5   P2   T2   P0   A2   TAB0   SAB2   E0   M0   L2     # Outcome Date GA Lbr Len/2nd Weight Sex Delivery Anes PTL Lv  5 Current           4 SAB 2016 6244w0d         3 SAB 2016 3261w0d         2 Term 01/11/14 7888w2d  6 lb 12 oz (3.062 kg) F Vag-Spont   Y     Complications: Fetal growth restriction  1 Term 04/28/12 2227w0d  7 lb 12 oz (3.515 kg) F Vag-Spont   Y      Gynecologic History:   States that [facility] and was within the last 3 years.  Past Medical History: Patient  has a past medical history of Cholelithiasis; Morbid obesity (HCC); and Chronic hypertension.   Past Surgical History: She  has past surgical history that includes No past surgeries.   Medications:  Current Facility-Administered  Medications on File Prior to Encounter  Medication Dose Route Frequency Provider Last Rate Last Dose  . acetaminophen (TYLENOL) tablet 650 mg  650 mg Oral Q4H PRN Mary Mustardobert P Harris, MD   650 mg at 05/22/16 1527  . dextrose 5% in lactated ringers with KCl 20 mEq/L infusion   Intravenous Continuous Farrel Connersolleen Hensley, CNM 125 mL/hr at 05/22/16 0936    . famotidine (PEPCID) IVPB 20 mg premix  20 mg Intravenous Q12H Farrel Connersolleen Hensley, CNM   20 mg at 05/22/16 1634  . ondansetron (ZOFRAN) injection 4 mg  4 mg Intravenous Q6H PRN Mary Mustardobert P Harris, MD      . prochlorperazine (COMPAZINE) injection 10 mg  10 mg Intravenous Q4H PRN Mary Mustardobert P Harris, MD   10 mg at 05/22/16 [id number]   Current Outpatient Prescriptions on File Prior to Encounter  Medication Sig Dispense Refill  . ondansetron (ZOFRAN) 4 MG tablet Take 1 tablet (4 mg total) by mouth every 8 (eight) hours as needed for nausea or vomiting. 20 tablet 0  . Prenatal Vit-Fe Fumarate-FA (PRENATAL VITAMIN) 27-0.8 MG TABS  Take 1 tablet by mouth daily. 30 tablet 12     Allergies: Patient is allergic to penicillins. PCN causes a rash.  Social History: Patient  reports that she quit smoking about 5 months ago. She has never used smokeless tobacco. She reports that she uses illicit drugs (Marijuana) about once per week. She reports that she does not drink alcohol. She reports she lives in hotels, but other team members report that she lives in a [place] and uses facilities at truck stops along [address].  Her partner, who accompanied her to [facility] for the consult, is also unemployed.  Her mother, who lives in [place] has custody of her two other children, although the patient says she, herself, retains formal custody.  Family History: family history includes Diabetes in her father; Heart disease in her father; Hypertension in her father; Ovarian cancer in her mother.   Review of Systems A full 12 point review of systems was negative or as noted in the  History of Present Illness.  Physical Exam: T 98.5, HR 84, BP 132/82 (has ranged from 100-150/57-80). WT 326 lbs.  BMI 52.8.   Pulse oximetry is 100%.   Exam: Lungs - clear HR - RRR Abdomen - soft, tender only over left side of uterus, normal bowel sounds, no rebound or guarding, no liver or spleen enlargment Pelvic - deferred.  Cervix was closed yesterday Extremities - no edema, or erythema.  Homan's sign negative   [place] today: Single fetus. Cephalic.  There is no longer any remnant visible from the second embryo or gestational sac.  EFW 47%tile.  AFI = 13.4 cm.  Detailed evaluation of the fetal anatomy was performed.  The fetal anatomical survey appeared normal.  There was no explanation for her LLQ tenderness.    Urine drug screen 05/13/2106 - positive for [facility]  Lab Results  Component Value Date   WBC 5.1 05/22/2016   HGB 9.0* 05/22/2016   HCT 28.5* 05/22/2016   MCV 74.7* 05/22/2016   PLT 172 05/22/2016   05/22/2016 LFTs normal, BUN < 5, creat 0.34, TP 5.5, alb 2.4, random glucose 95  05/13/2016 - pancreatic enzymes normal.  Assessement and Recommendations: 30 yo gravida 5 para 2022 at [redacted]w[redacted]d gestation with: 1. Poor social situation, homelessness and no prenatal care -- While she is here, we could use the help of our SW team and maternity care coordinators from the [facility] to try and find a plan that will provide this patient with shelter and access to care.  She should be enrolled in [place].  Reasons she is reluctant to apply should be explored. -- With a plan in place, I think the patient may be able to be discharged tomorrow as long as her BP is stable and her vomiting largely resolved.   2. Morbid obesity -- The patient should have a baseline EKG -- While she is in house,  I would recommend obtaining a hemoglobin A1c. -- I would also recommend obtaining a TSH -- I would recommend that she plan to deliver at a tertiary care center.  She delivered her  last baby at [facility]. -- While she is here, I would recommend an anesthesia consult, in the event she would require delivery. -- After discharge, I would recommend a glucose screen. -- After discharge, I would recommend fetal surveillance to include monthly ultrasounds starting at 28 weeks, weekly antepartum testing starting at [redacted] weeks gestation and delivery by [redacted] weeks gestation.  If she develops gestational  diabetes, preeclampsia or other complication the intensity of surveillance would need to be increased. -- At delivery, I would recommend pneumatic compression devices in labor and postpartum. -- Should she have a cesarean delivery, I would recommend consideration of 3 gm instead of 2 gm of cefazolin for prophylaxis and application of a negative pressure dressing.   -- Whether or not she has a cesarean delivery, she would be a candidate for 3-4 weeks of thromboprophylaxis with 60-80 mg of enoxaparin  daily starting 12 hours after vaginal delivery or 24 hours after her cesarean delivery. 3. Chronic hypertension not on medication -- See recommendations for labs and fetal surveillance above 4. Intermittent sharp chest pain -- I would recommend a chest x-ray. 5. Anemia likely iron deficient but cannot exclude a hemoglobinopathy -- Hemoglobin electrophoresis, ferritin, vitamin B12, folate. -- If the ferritin is low, I would recommend iron infusion. I spoke with Dr. Judeen Hammans hematologist here at [facility]. There are practice uses [facility] and I would recommend it. Per [place]: Usual Adult Dose for Iron Deficiency Anemia is: Initial dose: 510 mg, IV, over at least 15 minutes Comments:The patient should be reclined or semi-reclined during administration. -- A second dose can be considered 3-8 days after the first dose.  6. 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   7. Suspect  malnutrition -- I would recommend a nutrition consult and while she is getting IV fluids, I would recommend supplementation with B vitamins.  Total time spent with the patient was 60 minutes with greater than 50% spent in counseling and coordination of care.  We appreciate this consult and will be happy to be involved in the ongoing care of Ms. Smelcer in anyway her obstetricians desire.  Argentina Ponder, MD [facility]

## 2016-05-22 NOTE — Progress Notes (Addendum)
PAD#1  S: Feeling better. IV infiltrated and patient able to keep down food and fluids and medication. Social work had 2 conversations with patient starting prenatal care, getting medicaid, living situation etc Decided to seek prenatal care at [facility] Has been having intermittent chest pains and reflux. Has known about cholelithiasis since her first pregnancy. [facility] Her recommendations were reviewed and appreciated  O:  Patient Vitals for the past 24 hrs:  BP Temp Temp src Pulse Resp SpO2 Height Weight  05/22/16 1513 138/82 mmHg 98.5 F (36.9 C) Oral 84 20 100 % - -  05/22/16 1104 - 98.3 F (36.8 C) Oral - 18 - - -  05/22/16 0821 - 98 F (36.7 C) Oral - 20 - 5\' 6"  (1.676 m) (!) 148.054 kg (326 lb 6.4 oz)  05/22/16 0819 (!) 148/57 mmHg - - 73 - - - -  05/22/16 0621 (!) 150/71 mmHg - - 81 - - - -  05/22/16 0409 - 98.3 F (36.8 C) Oral - - - - -  05/22/16 0348 137/62 mmHg - - 92 - - - -  05/22/16 0058 - 98.3 F (36.8 C) Oral - - - - -  05/22/16 0057 (!) 136/59 mmHg - - 84 - - - -  05/21/16 2122 100/76 mmHg 98.3 F (36.8 C) Oral 88 18 - - -  05/21/16 2115 - - - - - 100 % - -  05/21/16 2110 - - - - - 100 % - -  05/21/16 2105 - - - - - 100 % - -  05/21/16 2100 - - - - - 100 % - -  05/21/16 2055 (!) 150/80 mmHg - - 91 - 100 % - -   General: awake, alert and smiling IV site on right wrist swollen and inflamed, tender to touch-ice is being applied to the area Growth scan today-normal growth, normal AFI, normal anatomy Had first trimester ultrasound done (6wk3d) which gave her an EDC=08/16/2016 (one day difference from 17 week ultrasound-EDC changed)  A/P IUP at 27wk5d with normal growth on ultrasound Nausea and vomiting-improved, will leave IV out and replace potassium orally Anemia-labs ordered as outlined by Dr Fayrene FearingJames in Am TSH, hemoglobin A1C also orderedi in AM Chest pains-EKG and CXR  CHTN_consider antihypertensives if blood pressures sustained above  150/90 Cholelithiasis-discussed eating low fat diet, Pepcid po for reflux Consider nutrition/ GI/ hematology consults- may be able to see nutritionist at [facility] when starting her care there  If ferratin low-may refer to hematology for iron transfusion  Armetta Henri, CNM
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
## 2016-05-22 NOTE — Progress Notes (Addendum)
Progress Note  PAD 1 Nausea and vomiting  S: Sleeping after Compazine. Has not tried drinking liquids. Has generalized abdominal cramping, but is sleeping thru pain.  O: BP 148/57 mmHg  Pulse 73  Temp(Src) 98 F (36.7 C) (Oral)  Resp 20  Ht  (1.676 m)  Wt 148.054 kg (326 lb 6.4 oz)  BMI 52.71 kg/m2  SpO2 100%  Breastfeeding? Unknown  General: dry mouth, halitosis, sleeping upon examiner entering room Heart: RRR without murmur Abd: obese, non distended, generalized tenderness thru out abdomen, BS active FHTs 120s Results for orders placed or performed during the hospital encounter of 05/21/16 (from the past 24 hour(s))  Urinalysis complete, with microscopic ([facility] only)     Status: Abnormal   Collection Time: 05/21/16  6:26 PM  Result Value Ref Range   Color, Urine YELLOW (A) YELLOW   APPearance HAZY (A) CLEAR   Glucose, UA NEGATIVE NEGATIVE mg/dL   Bilirubin Urine NEGATIVE NEGATIVE   Ketones, ur 2+ (A) NEGATIVE mg/dL   Specific Gravity, Urine 1.021 1.005 - 1.030   Hgb urine dipstick NEGATIVE NEGATIVE   pH 6.0 5.0 - 8.0   Protein, ur 30 (A) NEGATIVE mg/dL   Nitrite NEGATIVE NEGATIVE   Leukocytes, UA 1+ (A) NEGATIVE   RBC / HPF 0-5 0 - 5 RBC/hpf   WBC, UA 6-30 0 - 5 WBC/hpf   Bacteria, UA NONE SEEN NONE SEEN   Squamous Epithelial / LPF 6-30 (A) NONE SEEN   Mucous PRESENT   Comprehensive metabolic panel     Status: Abnormal   Collection Time: 05/22/16  5:47 AM  Result Value Ref Range   Sodium 136 135 - 145 mmol/L   Potassium 2.8 (LL) 3.5 - 5.1 mmol/L   Chloride 108 101 - 111 mmol/L   CO2 22 22 - 32 mmol/L   Glucose, Bld 95 65 - 99 mg/dL   BUN <5 (L) 6 - 20 mg/dL   Creatinine, Ser 5.64 (L) 0.44 - 1.00 mg/dL   Calcium 8.0 (L) 8.9 - 10.3 mg/dL   Total Protein 5.5 (L) 6.5 - 8.1 g/dL   Albumin 2.4 (L) 3.5 - 5.0 g/dL   AST 11 (L) 15 - 41 U/L   ALT <5 (L) 14 - 54 U/L   Alkaline Phosphatase 65 38 - 126 U/L   Total Bilirubin 0.4 0.3 - 1.2 mg/dL   GFR calc non Af Amer  >60 >60 mL/min   GFR calc Af Amer >60 >60 mL/min   Anion gap 6 5 - 15  CBC     Status: Abnormal   Collection Time: 05/22/16  5:47 AM  Result Value Ref Range   WBC 5.1 3.6 - 11.0 K/uL   RBC 3.81 3.80 - 5.20 MIL/uL   Hemoglobin 9.0 (L) 12.0 - 16.0 g/dL   HCT 33.2 (L) 95.1 - 88.4 %   MCV 74.7 (L) 80.0 - 100.0 fL   MCH 23.7 (L) 26.0 - 34.0 pg   MCHC 31.8 (L) 32.0 - 36.0 g/dL   RDW 16.6 (H) 06.3 - 01.6 %   Platelets 172 150 - 440 K/uL  Type and screen [facility]     Status: None   Collection Time: 05/22/16  5:51 AM  Result Value Ref Range   ABO/RH(D) O POS    Antibody Screen NEG    Sample Expiration 05/25/2016    A: IUP at 27.6 weeks with no prenatal care Nausea and vomiting- better after Compazine  Hypokalemia CHTN-labile blood pressures  Morbid obesity-BMI=52  P: Transfer to mother baby unit [facility] consult for anatomy scan and POM CLear liquids-advance as tolerated to a BRAT diet Add 20 KCL to IV fluids Monitor blood pressures Social work consult regarding getting medicaid. Will talk to patient when more awake regarding importance of prenatal care  Kennita Pavlovich, CNM Add

## 2016-05-22 NOTE — Progress Notes (Signed)
[facility].  IV was turned off while there due to infiltration.  IV d/cd upon back to room 335.  Ice applied to site per Rushie Goltzolleen Guttierez, CNM.

## 2016-05-22 NOTE — Clinical Social Work Maternal (Signed)
  CLINICAL SOCIAL WORK MATERNAL/CHILD NOTE  Patient Details  Name: Mary Hensley MRN: [id number] Date of Birth: 07-Jul-1986  Date:  05/22/2016  Clinical Social Worker Initiating Note:   Blima Rich, [employer] [phone]) Date/ Time Initiated:  05/22/16/1357     Child's Name:      Legal Guardian:  Mother   Need for Interpreter:  None   Date of Referral:  05/22/16     Reason for Referral:  Homelessness    Referral Source:  RN   Address:   (N/A )  Phone number:      Household Members:  Spouse   Natural Supports (not living in the home):  Children   Professional Supports:     Employment: Unemployed   Type of Work:     Education:  Database administrator Resources:  Other(Comment) (none )   Other Resources:      Cultural/Religious Considerations Which May Impact Care:  N/A  Strengths:      Risk Factors/Current Problems:  Transportation , Basic Needs , Other (Comment) (Homelessness )   Cognitive State:  Distractible    Mood/Affect:  Comfortable    CSW Assessment: Clinical Education officer, museum (CSW) received call from RN stating that patient is homeless and needs housing and medicaid assistance. CSW met with patient and her husband was at bedside. CSW introduced self and explained role of CSW department. Patient's husband was asleep during assessment and did not participate in assessment. Per patient she is pregenant with her 3rd child and her 2 daughters are living with her mother in [place]. Per patient she and her husband are staying behind the [employer] in [place] and they cannot afford to stay in a hotel. Per patient she cannot go to the shelter because she was kicked out. Patient reported that she owes money to the [employer] and will not be eligbile for income based housing. CSW explored patient staying with family or friends. Per patient she cannot stay with her mother in [place] and her husband's family lives in [place]. Per patient  they cannot got to [place] because her other 2 children are in [place]. Patient reported that her and her husband stay in hotels when they have the money. CSW provided emotional support and [facility]. Per patient they eat what they can. Patient did not give a lot of information and was guarded during assessment. CSW made patient aware of the free community meal that is served daily at [employer]. Patient did not appear concerned or worried about her housing situation and reported that she would figure it out. Per patient her due date is in Sept. 2017. CSW made patient aware that a CPS report will be made when patient delivers if she does not have a housing plan. Patient verbalized her understanding.    CSW Plan/Description:  Information/Referral to [employer] , Psychosocial Support and Ongoing Assessment of Needs    Elwyn Reach 05/22/2016, 2:00 PM

## 2016-05-23 DIAGNOSIS — O162 Unspecified maternal hypertension, second trimester: Secondary | ICD-10-CM | POA: Diagnosis not present

## 2016-05-23 LAB — URINE CULTURE

## 2016-05-23 LAB — TSH: TSH: 1.818 u[IU]/mL (ref 0.350–4.500)

## 2016-05-23 LAB — FOLATE: FOLATE: 10.4 ng/mL (ref >5.9)

## 2016-05-23 LAB — HEMOGLOBIN A1C: Hgb A1c MFr Bld: 5.6 % (ref 4.0–6.0)

## 2016-05-23 LAB — VITAMIN B12: Vitamin B-12: 134 pg/mL — ABNORMAL LOW (ref 180–914)

## 2016-05-23 LAB — FERRITIN: Ferritin: 6 ng/mL — ABNORMAL LOW (ref 11–307)

## 2016-05-23 MED ORDER — CYCLOBENZAPRINE HCL 10 MG PO TABS
10.0000 mg | ORAL_TABLET | Freq: Three times a day (TID) | ORAL | Status: DC | PRN
  Administered 2016-05-23: 10 mg via ORAL
  Filled 2016-05-23: qty 1

## 2016-05-23 MED ORDER — PROMETHAZINE HCL 25 MG PO TABS: 25.0000 mg | ORAL_TABLET | Freq: Four times a day (QID) | ORAL | Status: DC | PRN

## 2016-05-23 MED ORDER — FAMOTIDINE 20 MG PO TABS: 20.0000 mg | ORAL_TABLET | Freq: Two times a day (BID) | ORAL | Status: DC

## 2016-05-23 NOTE — Progress Notes (Signed)
Per RN patient does not have transportation and is being discharged from [facility] today. Clinical Child psychotherapistocial Worker (CSW) provided patient with a taxi voucher to the [employer] hotel and provided a few bus passes. Patient and her husband thanked CSW for assistance. RN Case Manager will provide patient with medication management application to assist with meds. Please reconsult if future social work needs arise. CSW signing off.   Jetta LoutBailey Morgan, LCSW [phone]

## 2016-05-23 NOTE — Care Management (Signed)
Prescriptions faxed to Medication Management, Application for Medication Management given to Mary Hensley. Mr. Mary Hensley will go across the street for medications after this department re-opens at 1:30pm.  Taxi voucher issued for transportation to home. Gwenette GreetBrenda S Mir Fullilove RN MSN [facility] Care Management [phone]

## 2016-05-23 NOTE — Progress Notes (Signed)
All discharge instructions given to patient and she voices understanding of all instructions given. Taxi called , patient has voucher , social work gave assisted them with obtaining meds.

## 2016-05-23 NOTE — Discharge Summary (Signed)
Physician Discharge Summary  Patient ID: Mary Hensley MRN: [id number] DOB/AGE: June 25, 1986 30 y.o.  Admit date: 05/21/2016 Discharge date: 05/23/2016  Admission Diagnoses: Nausea and vomiting during pregnancy with electrolyte disturbance  Discharge Diagnoses:  Active Problems:   Nausea and vomiting of pregnancy, antepartum   Morbid obesity (HCC)   Chronic hypertension during pregnancy, antepartum   Discharged Condition: good  Hospital Course: Patient second presentation currently [redacted] weeks gestation with nausea and vomiting.  Patient was admitted received IV hydration and antiemetics,  Was transitioned to po regimen and at the time of discharge was tolerating a general diet with emesis and good control of nausea.    Consults: [facility] (MFM)  Significant Diagnostic Studies:  Results for orders placed or performed during the hospital encounter of 05/21/16 (from the past 72 hour(s))  Urinalysis complete, with microscopic ([facility] only)     Status: Abnormal   Collection Time: 05/21/16  6:26 PM  Result Value Ref Range   Color, Urine YELLOW (A) YELLOW   APPearance HAZY (A) CLEAR   Glucose, UA NEGATIVE NEGATIVE mg/dL   Bilirubin Urine NEGATIVE NEGATIVE   Ketones, ur 2+ (A) NEGATIVE mg/dL   Specific Gravity, Urine 1.021 1.005 - 1.030   Hgb urine dipstick NEGATIVE NEGATIVE   pH 6.0 5.0 - 8.0   Protein, ur 30 (A) NEGATIVE mg/dL   Nitrite NEGATIVE NEGATIVE   Leukocytes, UA 1+ (A) NEGATIVE   RBC / HPF 0-5 0 - 5 RBC/hpf   WBC, UA 6-30 0 - 5 WBC/hpf   Bacteria, UA NONE SEEN NONE SEEN   Squamous Epithelial / LPF 6-30 (A) NONE SEEN   Mucous PRESENT   Urine culture     Status: Abnormal   Collection Time: 05/21/16  6:26 PM  Result Value Ref Range   Specimen Description URINE, RANDOM    Special Requests NONE    Culture MULTIPLE SPECIES PRESENT, SUGGEST RECOLLECTION (A)    Report Status 05/23/2016 FINAL   Comprehensive metabolic panel     Status: Abnormal   Collection Time: 05/22/16   5:47 AM  Result Value Ref Range   Sodium 136 135 - 145 mmol/L   Potassium 2.8 (LL) 3.5 - 5.1 mmol/L    Comment: CRITICAL RESULT CALLED TO, READ BACK BY AND VERIFIED WITH JENNA SUGGS 05/22/16 0906 SGD    Chloride 108 101 - 111 mmol/L   CO2 22 22 - 32 mmol/L   Glucose, Bld 95 65 - 99 mg/dL   BUN <5 (L) 6 - 20 mg/dL   Creatinine, Ser 0.34 (L) 0.44 - 1.00 mg/dL   Calcium 8.0 (L) 8.9 - 10.3 mg/dL   Total Protein 5.5 (L) 6.5 - 8.1 g/dL   Albumin 2.4 (L) 3.5 - 5.0 g/dL   AST 11 (L) 15 - 41 U/L   ALT <5 (L) 14 - 54 U/L   Alkaline Phosphatase 65 38 - 126 U/L   Total Bilirubin 0.4 0.3 - 1.2 mg/dL   GFR calc non Af Amer >60 >60 mL/min   GFR calc Af Amer >60 >60 mL/min    Comment: (NOTE) The eGFR has been calculated using the CKD EPI equation. This calculation has not been validated in all clinical situations. eGFR's persistently <60 mL/min signify possible Chronic Kidney Disease.    Anion gap 6 5 - 15  CBC     Status: Abnormal   Collection Time: 05/22/16  5:47 AM  Result Value Ref Range   WBC 5.1 3.6 - 11.0 K/uL   RBC  3.81 3.80 - 5.20 MIL/uL   Hemoglobin 9.0 (L) 12.0 - 16.0 g/dL   HCT 28.5 (L) 35.0 - 47.0 %   MCV 74.7 (L) 80.0 - 100.0 fL   MCH 23.7 (L) 26.0 - 34.0 pg   MCHC 31.8 (L) 32.0 - 36.0 g/dL   RDW 16.5 (H) 11.5 - 14.5 %   Platelets 172 150 - 440 K/uL  Type and screen [facility]     Status: None   Collection Time: 05/22/16  5:51 AM  Result Value Ref Range   ABO/RH(D) O POS    Antibody Screen NEG    Sample Expiration 05/25/2016    Dg Chest 2 View  05/23/2016  CLINICAL DATA:  Chest pain in a pregnant patient. EXAM: CHEST  2 VIEW COMPARISON:  08/01/2014 FINDINGS: Cardiomediastinal silhouette is normal. Mediastinal contours appear intact. There is no evidence of focal airspace consolidation, pleural effusion or pneumothorax. Osseous structures are without acute abnormality. Soft tissues are grossly normal. IMPRESSION: No active cardiopulmonary disease.  Electronically Signed   By: Fidela Salisbury M.D.   On: 05/23/2016 08:26   [place] Mfm Ob Detail +14 Wk  05/22/2016  OBSTETRICAL ULTRASOUND: This exam was performed within a [place] [facility]. The OB US report was generated in the [facility] system, and faxed to the ordering physician.  This report is available in the [employer]. See the [facility] Obstetric US report via the Image Link.    Treatments: IV hydration  Discharge Exam: Blood pressure 139/67, pulse 80, temperature 98.4 F (36.9 C), temperature source Oral, resp. rate 18, height _0  (1.676 m), weight 148.054 kg (326 lb 6.4 oz), SpO2 100 %, unknown if currently breastfeeding. General appearance: alert and appears stated age GI: obese, soft, non-tender, gravid Extremities: extremities normal, atraumatic, no cyanosis or edema  Disposition: 01-Home or Self Care  Discharge Instructions    Discharge activity:  No Restrictions    Complete by:  As directed      No sexual activity restrictions    Complete by:  As directed      Notify physician for a general feeling that "something is not right"    Complete by:  As directed      Notify physician for increase or change in vaginal discharge    Complete by:  As directed      Notify physician for intestinal cramps, with or without diarrhea, sometimes described as "gas pain"    Complete by:  As directed      Notify physician for leaking of fluid    Complete by:  As directed      Notify physician for low, dull backache, unrelieved by heat or Tylenol    Complete by:  As directed      Notify physician for menstrual like cramps    Complete by:  As directed      Notify physician for pelvic pressure    Complete by:  As directed      Notify physician for uterine contractions.  These may be painless and feel like the uterus is tightening or the baby is  "balling up"    Complete by:  As directed      Notify physician for vaginal bleeding    Complete by:  As directed      PRETERM LABOR:   Includes any of the follwing symptoms that occur between 20 - [redacted] weeks gestation.  If these symptoms are not stopped, preterm labor can result in preterm delivery, placing  your baby at risk    Complete by:  As directed             Medication List    TAKE these medications        famotidine 20 MG tablet  Commonly known as:  PEPCID  Take 1 tablet (20 mg total) by mouth 2 (two) times daily.     ondansetron 4 MG tablet  Commonly known as:  ZOFRAN  Take 1 tablet (4 mg total) by mouth every 8 (eight) hours as needed for nausea or vomiting.     Prenatal Vitamin 27-0.8 MG Tabs  Take 1 tablet by mouth daily.     promethazine 25 MG tablet  Commonly known as:  PHENERGAN  Take 1 tablet (25 mg total) by mouth every 6 (six) hours as needed for nausea or vomiting.           Follow-up Information    Follow up with [facility]. Schedule an appointment as soon as possible for a visit in 1 week.   Contact information:   [place] [address] [phone]       Signed: Dorthula Nettles 05/23/2016, 10:24 AM

## 2016-05-23 NOTE — Progress Notes (Signed)
S:Called to see patient due to sudden onset LLQ pain and left sacroiliac pain. The pain began when she was trying to turn over in bed.  O:BP 156/58 mmHg  Pulse 82  Temp(Src) 98.5 F (36.9 C) (Oral)  Resp 18  Ht 5\' 6"  (1.676 m)  Wt 148.054 kg (326 lb 6.4 oz)  BMI 52.71 kg/m2  SpO2 100%  Breastfeeding? Unknown  General: patient lying down in bed on her right side, rocking when entering room Tenderness over LLQ along border of uterus with mild palpation and in left SI joint  A: possible left round ligament spasm and SI joint pain P: Just received Tylenol 30 min ago. Will add Flexeril; 10 mgm and apply heat to back.  Farrel ConnersGUTIERREZ, Raynard Mapps, CNM

## 2016-05-24 LAB — HEMOGLOBINOPATHY EVALUATION
HGB A2 QUANT: 2.1 % (ref 0.7–3.1)
HGB C: 0 %
HGB F QUANT: 0 % (ref 0.0–2.0)
HGB S QUANTITAION: 0 %
Hgb A: 97.9 % (ref 94.0–98.0)

## 2016-05-28 ENCOUNTER — Encounter (HOSPITAL_COMMUNITY): Payer: Self-pay | Admitting: *Deleted

## 2016-07-28 ENCOUNTER — Emergency Department (HOSPITAL_COMMUNITY)
Admission: EM | Admit: 2016-07-28 | Discharge: 2016-07-28 | Disposition: A | Discharge status: Home / Self Care | Payer: Medicaid Other | Attending: Emergency Medicine | Admitting: Emergency Medicine

## 2016-07-28 ENCOUNTER — Encounter (HOSPITAL_COMMUNITY): Payer: Self-pay | Admitting: Emergency Medicine

## 2016-07-28 DIAGNOSIS — Z87891 Personal history of nicotine dependence: Secondary | ICD-10-CM | POA: Diagnosis not present

## 2016-07-28 DIAGNOSIS — O10913 Unspecified pre-existing hypertension complicating pregnancy, third trimester: Secondary | ICD-10-CM | POA: Insufficient documentation

## 2016-07-28 DIAGNOSIS — O4703 False labor before 37 completed weeks of gestation, third trimester: Secondary | ICD-10-CM

## 2016-07-28 DIAGNOSIS — Z3A37 37 weeks gestation of pregnancy: Secondary | ICD-10-CM | POA: Insufficient documentation

## 2016-07-28 LAB — URINALYSIS, ROUTINE W REFLEX MICROSCOPIC
BILIRUBIN URINE: NEGATIVE
Glucose, UA: NEGATIVE mg/dL
HGB URINE DIPSTICK: NEGATIVE
KETONES UR: NEGATIVE mg/dL
NITRITE: NEGATIVE
Protein, ur: NEGATIVE mg/dL
Specific Gravity, Urine: 1.021 (ref 1.005–1.030)
pH: 6.5 (ref 5.0–8.0)

## 2016-07-28 LAB — BASIC METABOLIC PANEL
Anion gap: 5 (ref 5–15)
BUN: 5 mg/dL — ABNORMAL LOW (ref 6–20)
CALCIUM: 9.1 mg/dL (ref 8.9–10.3)
CO2: 21 mmol/L — ABNORMAL LOW (ref 22–32)
CREATININE: 0.72 mg/dL (ref 0.44–1.00)
Chloride: 109 mmol/L (ref 101–111)
GFR calc Af Amer: >60 mL/min (ref >60)
Glucose, Bld: 87 mg/dL (ref 65–99)
POTASSIUM: 3.8 mmol/L (ref 3.5–5.1)
SODIUM: 135 mmol/L (ref 135–145)

## 2016-07-28 LAB — CBC WITH DIFFERENTIAL/PLATELET
BASOS PCT: 0 %
Basophils Absolute: 0 10*3/uL (ref 0.0–0.1)
EOS ABS: 0.1 10*3/uL (ref 0.0–0.7)
Eosinophils Relative: 2 %
HCT: 30.7 % — ABNORMAL LOW (ref 36.0–46.0)
Hemoglobin: 9.2 g/dL — ABNORMAL LOW (ref 12.0–15.0)
LYMPHS ABS: 2.3 10*3/uL (ref 0.7–4.0)
Lymphocytes Relative: 32 %
MCH: 21.9 pg — AB (ref 26.0–34.0)
MCHC: 30 g/dL (ref 30.0–36.0)
MCV: 72.9 fL — ABNORMAL LOW (ref 78.0–100.0)
MONO ABS: 0.6 10*3/uL (ref 0.1–1.0)
Monocytes Relative: 8 %
NEUTROS PCT: 58 %
Neutro Abs: 4.3 10*3/uL (ref 1.7–7.7)
PLATELETS: 251 10*3/uL (ref 150–400)
RBC: 4.21 MIL/uL (ref 3.87–5.11)
RDW: 16.7 % — AB (ref 11.5–15.5)
WBC: 7.3 10*3/uL (ref 4.0–10.5)

## 2016-07-28 LAB — URINE MICROSCOPIC-ADD ON

## 2016-07-28 LAB — HEPATIC FUNCTION PANEL
ALBUMIN: 2.6 g/dL — AB (ref 3.5–5.0)
ALT: 9 U/L — ABNORMAL LOW (ref 14–54)
AST: 14 U/L — ABNORMAL LOW (ref 15–41)
Alkaline Phosphatase: 146 U/L — ABNORMAL HIGH (ref 38–126)
Bilirubin, Direct: <0.1 mg/dL — ABNORMAL LOW (ref 0.1–0.5)
TOTAL PROTEIN: 6 g/dL — AB (ref 6.5–8.1)
Total Bilirubin: 0.5 mg/dL (ref 0.3–1.2)

## 2016-07-28 LAB — OB RESULTS CONSOLE GBS: GBS: NEGATIVE

## 2016-07-28 LAB — PROTEIN / CREATININE RATIO, URINE
CREATININE, URINE: 199.66 mg/dL
Protein Creatinine Ratio: 0.06 mg/mg{Cre} (ref 0.00–0.15)
TOTAL PROTEIN, URINE: 11 mg/dL

## 2016-07-28 LAB — OB RESULTS CONSOLE GC/CHLAMYDIA: Gonorrhea: NEGATIVE

## 2016-07-28 MED ORDER — SODIUM CHLORIDE 0.9 % IV BOLUS (SEPSIS)
1000.0000 mL | Freq: Once | INTRAVENOUS | Status: AC
  Administered 2016-07-28: 1000 mL via INTRAVENOUS

## 2016-07-28 NOTE — ED Triage Notes (Signed)
Pt presents to ED with "contrations" pt states she is having lower abd pain with "pressure" in pelvic region. Pt states she has had no prenatal care and "doesnt remember when my last menstrual period was. Pt reports, "I think I am [redacted] weeks pregnant. Rapid OB nurse en route.

## 2016-07-28 NOTE — ED Notes (Signed)
MD at bedside [place] 

## 2016-07-28 NOTE — ED Notes (Signed)
This note also relates to the following rows which could not be included: BP - Cannot attach notes to unvalidated device data Pulse Rate - Cannot attach notes to unvalidated device data SpO2 - Cannot attach notes to unvalidated device data  RROB spoke with Dr Erin FullingHarraway-Smith; told all of pt's information, history, sve, fhr, uc's, lab work-orders to d/c pt; [facility] and patient declined prenatal care

## 2016-07-28 NOTE — ED Provider Notes (Signed)
[facility]-EMER[facility]ENCY DEPT Provider Note   CSN: [id number] Arrival date & time: 07/28/16  1927    History   Chief Complaint Chief Complaint  Patient presents with  . Contractions    HPI Mary Hensley is a 30 y.o. female.  The history is provided by the patient and medical records.   30 year old G5 P2 at 37 weeks, 2 days presenting with complaint of contractions. Onset was yesterday. Increasing in frequency since then. Now every 7 minutes apart. Described as whole belly tightening, discomfort and low back. Associated with loss of mucous plug yesterday. Not alleviated by resting. Similar to prior episodes of active labor. Patient has had 2 prior spontaneous vaginal deliveries and 2 prior abortions, no surgeries or C-sections. She has had limited prenatal care this pregnancy- however, she was seen in June at [facility] and had her prenatal panel drawn then. She has mild leg edema and headaches, occasional RUQ discomfort. No vision changes currently. No nausea, vomiting recently. No fevers. She has had a small amount of LOF today after intercourse. No vaginal bleeding. Feeling good fetal movement.    Past Medical History:  Diagnosis Date  . Cholelithiasis    Diagnosed in 2013  . Chronic hypertension    No medications  . Morbid obesity [facility])     Patient Active Problem List   Diagnosis Date Noted  . Morbid obesity (HCC) 05/22/2016  . Chronic hypertension during pregnancy, antepartum 05/22/2016  . History of inadequate prenatal care 05/22/2016  . Homelessness 05/22/2016  . Anemia 05/22/2016  . Nausea and vomiting of pregnancy, antepartum 05/21/2016  . Abdominal cramping affecting pregnancy 05/14/2016    Past Surgical History:  Procedure Laterality Date  . NO PAST SURGERIES      OB History    Gravida Para Term Preterm AB Living   5 2 2  0 2 2   SAB TAB Ectopic Multiple Live Births   2     0 2       Home Medications    Prior to Admission medications   Medication Sig Start  Date End Date Taking? Authorizing Provider  promethazine (PHENERGAN) 25 MG tablet Take 1 tablet (25 mg total) by mouth every 6 (six) hours as needed for nausea or vomiting. 05/23/16  Yes Vena Austria, MD  famotidine (PEPCID) 20 MG tablet Take 1 tablet (20 mg total) by mouth 2 (two) times daily. Patient not taking: Reported on 07/28/2016 05/23/16   Vena Austria, MD  ondansetron (ZOFRAN) 4 MG tablet Take 1 tablet (4 mg total) by mouth every 8 (eight) hours as needed for nausea or vomiting. Patient not taking: Reported on 07/28/2016 05/15/16   Nadara Mustard, MD  Prenatal Vit-Fe Fumarate-FA (PRENATAL VITAMIN) 27-0.8 MG TABS Take 1 tablet by mouth daily. Patient not taking: Reported on 07/28/2016 05/15/16   Nadara Mustard, MD    Family History Family History  Problem Relation Age of Onset  . Ovarian cancer Mother   . Diabetes Father   . Hypertension Father   . Heart disease Father     Social History Social History  Substance Use Topics  . Smoking status: Former Smoker    Quit date: 11/27/2015  . Smokeless tobacco: Never Used  . Alcohol use No     Allergies   Penicillins   Review of Systems Review of Systems  Constitutional: Negative for fever.  Eyes: Negative for visual disturbance.  Respiratory: Negative for cough and shortness of breath.   Cardiovascular: Negative for chest pain.  Gastrointestinal:  Positive for abdominal pain and constipation. Negative for nausea and vomiting.  Genitourinary: Negative for dysuria, flank pain and vaginal bleeding.  Musculoskeletal: Negative for arthralgias and back pain.  Skin: Negative for rash.  Neurological: Positive for headaches. Negative for tremors.  Hematological: Does not bruise/bleed easily.  All other systems reviewed and are negative.   Physical Exam Updated Vital Signs BP 114/71   Pulse 89   Temp 98 F (36.7 C) (Oral)   Resp 16   Ht 5\' 5"  (1.651 m)   Wt (!) 147.9 kg   SpO2 100%   BMI 54.25 kg/m   Physical Exam   Constitutional: She appears well-developed and well-nourished. No distress.  HENT:  Head: Normocephalic and atraumatic.  Eyes: Conjunctivae are normal.  Neck: Neck supple.  Cardiovascular: Normal rate and regular rhythm.   No murmur heard. Pulmonary/Chest: Effort normal and breath sounds normal. No respiratory distress.  Abdominal: Soft. There is no tenderness.  Gravid. Obese.   Genitourinary:  Genitourinary Comments: Per OB nurse- 2cm dilated/ 0 / 0   Musculoskeletal: She exhibits edema (trace to 1+ bilat).  Neurological: She is alert.  1-2 beats of clonus  Skin: Skin is warm and dry.  Psychiatric: She has a normal mood and affect.  Nursing note and vitals reviewed.   ED Treatments / Results  Labs (all labs ordered are listed, but only abnormal results are displayed) Labs Reviewed  CBC WITH DIFFERENTIAL/PLATELET - Abnormal; Notable for the following:       Result Value   Hemoglobin 9.2 (*)    HCT 30.7 (*)    MCV 72.9 (*)    MCH 21.9 (*)    RDW 16.7 (*)    All other components within normal limits  BASIC METABOLIC PANEL - Abnormal; Notable for the following:    CO2 21 (*)    BUN 5 (*)    All other components within normal limits  HEPATIC FUNCTION PANEL - Abnormal; Notable for the following:    Total Protein 6.0 (*)    Albumin 2.6 (*)    AST 14 (*)    ALT 9 (*)    Alkaline Phosphatase 146 (*)    Bilirubin, Direct <0.1 (*)    All other components within normal limits  URINALYSIS, ROUTINE W REFLEX MICROSCOPIC (NOT AT [facility]) - Abnormal; Notable for the following:    APPearance CLOUDY (*)    Leukocytes, UA MODERATE (*)    All other components within normal limits  URINE MICROSCOPIC-ADD ON - Abnormal; Notable for the following:    Squamous Epithelial / LPF 6-30 (*)    Bacteria, UA FEW (*)    All other components within normal limits  CULTURE, BETA STREP (GROUP B ONLY)  PROTEIN / CREATININE RATIO, URINE  GC/CHLAMYDIA PROBE AMP ([place]) NOT AT [facility]    EKG  EKG  Interpretation None       Radiology No results found.  Procedures Procedures (including critical care time)  Medications Ordered in ED Medications  sodium chloride 0.9 % bolus 1,000 mL (0 mLs Intravenous Stopped 07/28/16 2315)    Initial Impression / Assessment and Plan / ED Course  I have reviewed the triage vital signs and the nursing notes.  Pertinent labs & imaging results that were available during my care of the patient were reviewed by me and considered in my medical decision making (see chart for details).  Clinical Course    30 year old G5 P2 at 37 weeks, 2 days presenting with complaint of contractions,  as above. AF, initially mildly hypertensive, but blood pressure normalized on subsequent checks. FHT 133. Attached to toco/fetal monitoring and rapid OB RN at bedside to facilitate evaluation.   Bedside ultrasound confirmed vertex position. OB RN performed SVE- 2 / 0 / 0, no pooling of fluid. Recent prenatal labs reviewed notable for blood type O positive, antibody negative, serologies normal.   Labs here notable for anemia at hemoglobin 9.2, normal platelets, normal LFTs, normal urine P/C- no evidence of consistent gestational hypertension or preeclampsia.  OB rapid response RN shared information with OB provider. Pt feeling better after IVF. Do not feel she is active labor at this time. Group B strep and GC/CT swabs sent. Offered prenatal appointment with women's health, though the patient declined. Given strict return precautions. Dc in stable condition.   Case discussed with Dr. Dalene Seltzer, who oversaw management of this patient.    Final Clinical Impressions(s) / ED Diagnoses   Final diagnoses:  Preterm contractions, third trimester    New Prescriptions Discharge Medication List as of 07/28/2016 10:46 PM       Urban Gibson, MD 07/29/16 1610    Alvira Monday, MD 07/30/16 2320

## 2016-07-28 NOTE — ED Notes (Signed)
This note also relates to the following rows which could not be included: BP - Cannot attach notes to unvalidated device data Pulse Rate - Cannot attach notes to unvalidated device data SpO2 - Cannot attach notes to unvalidated device data  RROB in to see pt at 37 2/7 weeks with complaints of contractions since yesterday at 1730; no vaginal bleeding; pt states having some leaking but last intercourse was earlier today; positive fetal movement; pt has c/o headaches-no meds taken for it, dizziness (no visual changes), pain in her rt side under her ribs; swelling/pt states that she keeps her legs elevated a lot; +1 edema; was unable to get reflexes on knees; 2 beats of clonus rt foot

## 2016-07-28 NOTE — ED Notes (Signed)
Rapid OB nurse at bedside. [place] monitor on. FHT 133.

## 2016-07-31 LAB — CULTURE, BETA STREP (GROUP B ONLY)

## 2016-07-31 LAB — GC/CHLAMYDIA PROBE AMP (~~LOC~~) NOT AT ARMC
Chlamydia: NEGATIVE
NEISSERIA GONORRHEA: NEGATIVE

## 2016-08-02 ENCOUNTER — Inpatient Hospital Stay (EMERGENCY_DEPARTMENT_HOSPITAL)
Admission: AD | Admit: 2016-08-02 | Discharge: 2016-08-03 | Disposition: A | Discharge status: Home / Self Care | Payer: Medicaid Other | Source: Ambulatory Visit | Attending: Obstetrics and Gynecology | Admitting: Obstetrics and Gynecology

## 2016-08-02 ENCOUNTER — Encounter (HOSPITAL_COMMUNITY): Payer: Self-pay | Admitting: Certified Nurse Midwife

## 2016-08-02 DIAGNOSIS — O288 Other abnormal findings on antenatal screening of mother: Secondary | ICD-10-CM

## 2016-08-02 DIAGNOSIS — Z3A38 38 weeks gestation of pregnancy: Secondary | ICD-10-CM

## 2016-08-02 DIAGNOSIS — O471 False labor at or after 37 completed weeks of gestation: Secondary | ICD-10-CM

## 2016-08-02 DIAGNOSIS — Z87891 Personal history of nicotine dependence: Secondary | ICD-10-CM

## 2016-08-02 DIAGNOSIS — O0933 Supervision of pregnancy with insufficient antenatal care, third trimester: Secondary | ICD-10-CM

## 2016-08-02 DIAGNOSIS — O99213 Obesity complicating pregnancy, third trimester: Secondary | ICD-10-CM | POA: Insufficient documentation

## 2016-08-02 DIAGNOSIS — O10913 Unspecified pre-existing hypertension complicating pregnancy, third trimester: Secondary | ICD-10-CM

## 2016-08-02 NOTE — MAU Note (Signed)
Pt arrives via EMS for a labor eval. Pt states ctxs started at [facility] and are 4 minutes apart. Pt denies LOF or vaginal bleeding. Fetus active.

## 2016-08-03 ENCOUNTER — Inpatient Hospital Stay (HOSPITAL_COMMUNITY): Payer: Medicaid Other

## 2016-08-03 DIAGNOSIS — O471 False labor at or after 37 completed weeks of gestation: Secondary | ICD-10-CM

## 2016-08-03 DIAGNOSIS — Z3A38 38 weeks gestation of pregnancy: Secondary | ICD-10-CM

## 2016-08-03 LAB — URINALYSIS, ROUTINE W REFLEX MICROSCOPIC
Glucose, UA: NEGATIVE mg/dL
HGB URINE DIPSTICK: NEGATIVE
Ketones, ur: >80 mg/dL — AB
NITRITE: NEGATIVE
PH: 6.5 (ref 5.0–8.0)
Protein, ur: NEGATIVE mg/dL
SPECIFIC GRAVITY, URINE: 1.025 (ref 1.005–1.030)

## 2016-08-03 LAB — URINE MICROSCOPIC-ADD ON

## 2016-08-03 MED ORDER — GI COCKTAIL ~~LOC~~
30.0000 mL | Freq: Three times a day (TID) | ORAL | Status: DC | PRN
  Administered 2016-08-03: 30 mL via ORAL
  Filled 2016-08-03: qty 30

## 2016-08-03 NOTE — Discharge Instructions (Signed)
Braxton Hicks Contractions °Contractions of the uterus can occur throughout pregnancy. Contractions are not always a sign that you are in labor.  °WHAT ARE BRAXTON HICKS CONTRACTIONS?  °Contractions that occur before labor are called Braxton Hicks contractions, or false labor. Toward the end of pregnancy (32-34 weeks), these contractions can develop more often and may become more forceful. This is not true labor because these contractions do not result in opening (dilatation) and thinning of the cervix. They are sometimes difficult to tell apart from true labor because these contractions can be forceful and people have different pain tolerances. You should not feel embarrassed if you go to the hospital with false labor. Sometimes, the only way to tell if you are in true labor is for your health care provider to look for changes in the cervix. °If there are no prenatal problems or other health problems associated with the pregnancy, it is completely safe to be sent home with false labor and await the onset of true labor. °HOW CAN YOU TELL THE DIFFERENCE BETWEEN TRUE AND FALSE LABOR? °False Labor °· The contractions of false labor are usually shorter and not as hard as those of true labor.   °· The contractions are usually irregular.   °· The contractions are often felt in the front of the lower abdomen and in the groin.   °· The contractions may go away when you walk around or change positions while lying down.   °· The contractions get weaker and are shorter lasting as time goes on.   °· The contractions do not usually become progressively stronger, regular, and closer together as with true labor.   °True Labor °· Contractions in true labor last 30-70 seconds, become very regular, usually become more intense, and increase in frequency.   °· The contractions do not go away with walking.   °· The discomfort is usually felt in the top of the uterus and spreads to the lower abdomen and low back.   °· True labor can be  determined by your health care provider with an exam. This will show that the cervix is dilating and getting thinner.   °WHAT TO REMEMBER °· Keep up with your usual exercises and follow other instructions given by your health care provider.   °· Take medicines as directed by your health care provider.   °· Keep your regular prenatal appointments.   °· Eat and drink lightly if you think you are going into labor.   °· If Braxton Hicks contractions are making you uncomfortable:   °¨ Change your position from lying down or resting to walking, or from walking to resting.   °¨ Sit and rest in a tub of warm water.   °¨ Drink 2-3 glasses of water. Dehydration may cause these contractions.   °¨ Do slow and deep breathing several times an hour.   °WHEN SHOULD I SEEK IMMEDIATE MEDICAL CARE? °Seek immediate medical care if: °· Your contractions become stronger, more regular, and closer together.   °· You have fluid leaking or gushing from your vagina.   °· You have a fever.   °· You pass blood-tinged mucus.   °· You have vaginal bleeding.   °· You have continuous abdominal pain.   °· You have low back pain that you never had before.   °· You feel your baby's head pushing down and causing pelvic pressure.   °· Your baby is not moving as much as it used to.   °  °This information is not intended to replace advice given to you by your health care provider. Make sure you discuss any questions you have with your health care   provider. °  °Document Released: 11/20/2005 Document Revised: 11/25/2013 Document Reviewed: 09/01/2013 °Elsevier Interactive Patient Education ©2016 [employer] ° °

## 2016-08-03 NOTE — MAU Provider Note (Signed)
History     CSN: [id number]  Arrival date and time: 08/02/16 2235   None     Chief Complaint  Patient presents with  . Labor Eval   HPI Pt is a [id number] @ 38.1wks who arrives with c/o cramping/contractions very 4 minutes for past 2 hours. Baby active, denies LOF or vaginal bleeding   Past Medical History:  Diagnosis Date  . Cholelithiasis    Diagnosed in 2013  . Chronic hypertension    No medications  . Morbid obesity (HCC)     Past Surgical History:  Procedure Laterality Date  . NO PAST SURGERIES      Family History  Problem Relation Age of Onset  . Ovarian cancer Mother   . Diabetes Father   . Hypertension Father   . Heart disease Father     Social History  Substance Use Topics  . Smoking status: Former Smoker    Quit date: 11/27/2015  . Smokeless tobacco: Never Used  . Alcohol use No    Allergies:  Allergies  Allergen Reactions  . Penicillins Hives    Prescriptions Prior to Admission  Medication Sig Dispense Refill Last Dose  . famotidine (PEPCID) 20 MG tablet Take 1 tablet (20 mg total) by mouth 2 (two) times daily. (Patient not taking: Reported on 07/28/2016) 60 tablet 0 Not Taking at Unknown time  . ondansetron (ZOFRAN) 4 MG tablet Take 1 tablet (4 mg total) by mouth every 8 (eight) hours as needed for nausea or vomiting. (Patient not taking: Reported on 07/28/2016) 20 tablet 0 Completed Course at Unknown time  . Prenatal Vit-Fe Fumarate-FA (PRENATAL VITAMIN) 27-0.8 MG TABS Take 1 tablet by mouth daily. (Patient not taking: Reported on 07/28/2016) 30 tablet 12 Not Taking at Unknown time  . promethazine (PHENERGAN) 25 MG tablet Take 1 tablet (25 mg total) by mouth every 6 (six) hours as needed for nausea or vomiting. 30 tablet 0 Past Week at Unknown time    Review of Systems  Constitutional: Negative.   HENT: Negative.   Eyes: Negative.   Respiratory: Negative.   Cardiovascular: Negative.   Gastrointestinal: Positive for heartburn.  Genitourinary:  Negative.   Musculoskeletal: Negative.   Skin: Negative.   Neurological: Negative.   Endo/Heme/Allergies: Negative.   Psychiatric/Behavioral: Negative.    Physical Exam   Blood pressure 137/76, pulse 107, temperature 98.2 F (36.8 C), temperature source Oral, resp. rate 18, unknown if currently breastfeeding.  Physical Exam  Constitutional: She is oriented to person, place, and time. She appears well-developed and well-nourished.  HENT:  Head: Normocephalic.  Eyes: Conjunctivae are normal. Pupils are equal, round, and reactive to light.  Neck: Normal range of motion.  Cardiovascular: Normal rate, regular rhythm and normal heart sounds.   Respiratory: Effort normal and breath sounds normal.  Genitourinary: Vagina normal and uterus normal.  Musculoskeletal: Normal range of motion.  Neurological: She is alert and oriented to person, place, and time.  Skin: Skin is warm and dry.  Psychiatric: She has a normal mood and affect. Her behavior is normal.    MAU Course  Procedures  MDM Orders Placed This Encounter  Procedures  . [place] MFM Fetal BPP Wo Non Stress    Standing Status:   Standing    Number of Occurrences:   1    Order Specific Question:   What location should the exam be performed?    Answer:   [id number]    Order Specific Question:   Symptom/Reason for Exam  Answer:   Non-reactive NST (non-stress test) [161096][[id number]]  . Urinalysis, Routine w reflex microscopic (not at [facility])    Standing Status:   Standing    Number of Occurrences:   1  . Urine microscopic-add on    Standing Status:   Standing    Number of Occurrences:   1  .enc Pt resting comfortable in bed, states contractions have spaced apart, SVE done. Cervix unchanged, GI cocktail given for reflux. Baby active.  BPP done. 8/8 D[facility]  D/C home Labor precautions reviewed  Stacie Diette 08/03/2016, 2:02 AM   CNM attestation:  Clare Charonerri D Darnold is a 30 y.o. [id number] reporting  reg ctx +FM, denies LOF, VB, vaginal discharge.  PE: BP 123/61 (BP Location: Left Arm)   Pulse 85   Temp 98.2 F (36.8 C) (Oral)   Resp 18  Gen: calm comfortable, NAD Resp: normal effort, no distress Abd: gravid  ROS, labs, PMH reviewed NST reactive   Plan: - fetal kick counts reinforced, labor precautions - [facility] (pt desires)  Cam HaiSHAW, KIMBERLY, CNM 11:50 PM

## 2016-08-04 ENCOUNTER — Inpatient Hospital Stay (HOSPITAL_COMMUNITY): Payer: Medicaid Other | Admitting: Anesthesiology

## 2016-08-04 ENCOUNTER — Encounter (HOSPITAL_COMMUNITY): Payer: Self-pay | Admitting: Neonatology

## 2016-08-04 ENCOUNTER — Encounter (HOSPITAL_COMMUNITY): Payer: Self-pay | Admitting: Anesthesiology

## 2016-08-04 ENCOUNTER — Inpatient Hospital Stay (HOSPITAL_COMMUNITY)
Admission: AD | Admit: 2016-08-04 | Discharge: 2016-08-06 | DRG: 774 | Disposition: A | Discharge status: Home / Self Care | Payer: Medicaid Other | Source: Ambulatory Visit | Attending: Obstetrics and Gynecology | Admitting: Obstetrics and Gynecology

## 2016-08-04 DIAGNOSIS — O99324 Drug use complicating childbirth: Secondary | ICD-10-CM | POA: Diagnosis present

## 2016-08-04 DIAGNOSIS — Z3A38 38 weeks gestation of pregnancy: Secondary | ICD-10-CM

## 2016-08-04 DIAGNOSIS — Z3483 Encounter for supervision of other normal pregnancy, third trimester: Secondary | ICD-10-CM | POA: Diagnosis present

## 2016-08-04 DIAGNOSIS — Z87891 Personal history of nicotine dependence: Secondary | ICD-10-CM

## 2016-08-04 DIAGNOSIS — O99214 Obesity complicating childbirth: Secondary | ICD-10-CM | POA: Diagnosis present

## 2016-08-04 DIAGNOSIS — O1002 Pre-existing essential hypertension complicating childbirth: Secondary | ICD-10-CM

## 2016-08-04 DIAGNOSIS — Z833 Family history of diabetes mellitus: Secondary | ICD-10-CM | POA: Diagnosis not present

## 2016-08-04 DIAGNOSIS — F1290 Cannabis use, unspecified, uncomplicated: Secondary | ICD-10-CM | POA: Diagnosis present

## 2016-08-04 DIAGNOSIS — IMO0001 Reserved for inherently not codable concepts without codable children: Secondary | ICD-10-CM

## 2016-08-04 DIAGNOSIS — Z6841 Body Mass Index (BMI) 40.0 and over, adult: Secondary | ICD-10-CM | POA: Diagnosis not present

## 2016-08-04 DIAGNOSIS — Z8249 Family history of ischemic heart disease and other diseases of the circulatory system: Secondary | ICD-10-CM

## 2016-08-04 LAB — CBC
HEMATOCRIT: 32.1 % — AB (ref 36.0–46.0)
Hemoglobin: 10.3 g/dL — ABNORMAL LOW (ref 12.0–15.0)
MCH: 22.3 pg — ABNORMAL LOW (ref 26.0–34.0)
MCHC: 32.1 g/dL (ref 30.0–36.0)
MCV: 69.5 fL — AB (ref 78.0–100.0)
Platelets: 281 10*3/uL (ref 150–400)
RBC: 4.62 MIL/uL (ref 3.87–5.11)
RDW: 17.3 % — AB (ref 11.5–15.5)
WBC: 10.6 10*3/uL — AB (ref 4.0–10.5)

## 2016-08-04 LAB — TYPE AND SCREEN
ABO/RH(D): O POS
Antibody Screen: NEGATIVE

## 2016-08-04 LAB — ABO/RH: ABO/RH(D): O POS

## 2016-08-04 MED ORDER — PRENATAL MULTIVITAMIN CH
1.0000 | ORAL_TABLET | Freq: Every day | ORAL | Status: DC
  Administered 2016-08-04 – 2016-08-06 (×3): 1 via ORAL
  Filled 2016-08-04 (×3): qty 1

## 2016-08-04 MED ORDER — PHENYLEPHRINE 40 MCG/ML (10ML) SYRINGE FOR IV PUSH (FOR BLOOD PRESSURE SUPPORT)
80.0000 ug | PREFILLED_SYRINGE | INTRAVENOUS | Status: DC | PRN
  Filled 2016-08-04: qty 10
  Filled 2016-08-04: qty 5

## 2016-08-04 MED ORDER — LIDOCAINE HCL (PF) 1 % IJ SOLN
INTRAMUSCULAR | Status: DC | PRN
  Administered 2016-08-04 (×2): 5 mL

## 2016-08-04 MED ORDER — FENTANYL 2.5 MCG/ML BUPIVACAINE 1/10 % EPIDURAL INFUSION (WH - ANES)
14.0000 mL/h | INTRAMUSCULAR | Status: DC | PRN
  Administered 2016-08-04: 14 mL/h via EPIDURAL
  Filled 2016-08-04: qty 125

## 2016-08-04 MED ORDER — LACTATED RINGERS IV SOLN: 500.0000 mL | Freq: Once | INTRAVENOUS | Status: DC

## 2016-08-04 MED ORDER — ACETAMINOPHEN 325 MG PO TABS
650.0000 mg | ORAL_TABLET | ORAL | Status: DC | PRN
  Administered 2016-08-04 – 2016-08-06 (×5): 650 mg via ORAL
  Filled 2016-08-04 (×5): qty 2

## 2016-08-04 MED ORDER — WITCH HAZEL-GLYCERIN EX PADS: 1.0000 "application " | MEDICATED_PAD | CUTANEOUS | Status: DC | PRN

## 2016-08-04 MED ORDER — FENTANYL CITRATE (PF) 100 MCG/2ML IJ SOLN
100.0000 ug | Freq: Once | INTRAMUSCULAR | Status: AC
  Administered 2016-08-04: 100 ug via INTRAVENOUS

## 2016-08-04 MED ORDER — PHENYLEPHRINE 40 MCG/ML (10ML) SYRINGE FOR IV PUSH (FOR BLOOD PRESSURE SUPPORT): 80.0000 ug | PREFILLED_SYRINGE | INTRAVENOUS | Status: DC | PRN

## 2016-08-04 MED ORDER — LACTATED RINGERS IV SOLN
INTRAVENOUS | Status: DC
  Administered 2016-08-04: 05:00:00 via INTRAVENOUS

## 2016-08-04 MED ORDER — DIPHENHYDRAMINE HCL 50 MG/ML IJ SOLN: 12.5000 mg | INTRAMUSCULAR | Status: DC | PRN

## 2016-08-04 MED ORDER — BENZOCAINE-MENTHOL 20-0.5 % EX AERO
1.0000 "application " | INHALATION_SPRAY | CUTANEOUS | Status: DC | PRN
  Filled 2016-08-04: qty 56

## 2016-08-04 MED ORDER — FLEET ENEMA 7-19 GM/118ML RE ENEM: 1.0000 | ENEMA | RECTAL | Status: DC | PRN

## 2016-08-04 MED ORDER — EPHEDRINE 5 MG/ML INJ: 10.0000 mg | INTRAVENOUS | Status: DC | PRN

## 2016-08-04 MED ORDER — DIPHENHYDRAMINE HCL 25 MG PO CAPS: 25.0000 mg | ORAL_CAPSULE | Freq: Four times a day (QID) | ORAL | Status: DC | PRN

## 2016-08-04 MED ORDER — SENNOSIDES-DOCUSATE SODIUM 8.6-50 MG PO TABS
2.0000 | ORAL_TABLET | ORAL | Status: DC
  Administered 2016-08-04 – 2016-08-06 (×2): 2 via ORAL
  Filled 2016-08-04 (×2): qty 2

## 2016-08-04 MED ORDER — LIDOCAINE HCL (PF) 1 % IJ SOLN
30.0000 mL | INTRAMUSCULAR | Status: DC | PRN
  Filled 2016-08-04: qty 30

## 2016-08-04 MED ORDER — OXYCODONE-ACETAMINOPHEN 5-325 MG PO TABS: 2.0000 | ORAL_TABLET | ORAL | Status: DC | PRN

## 2016-08-04 MED ORDER — TETANUS-DIPHTH-ACELL PERTUSSIS 5-2.5-18.5 LF-MCG/0.5 IM SUSP
0.5000 mL | Freq: Once | INTRAMUSCULAR | Status: AC
  Administered 2016-08-05: 0.5 mL via INTRAMUSCULAR
  Filled 2016-08-04: qty 0.5

## 2016-08-04 MED ORDER — EPHEDRINE 5 MG/ML INJ
10.0000 mg | INTRAVENOUS | Status: DC | PRN
  Filled 2016-08-04: qty 4

## 2016-08-04 MED ORDER — ONDANSETRON HCL 4 MG/2ML IJ SOLN: 4.0000 mg | Freq: Four times a day (QID) | INTRAMUSCULAR | Status: DC | PRN

## 2016-08-04 MED ORDER — OXYCODONE-ACETAMINOPHEN 5-325 MG PO TABS: 1.0000 | ORAL_TABLET | ORAL | Status: DC | PRN

## 2016-08-04 MED ORDER — OXYTOCIN 40 UNITS IN LACTATED RINGERS INFUSION - SIMPLE MED
2.5000 [IU]/h | INTRAVENOUS | Status: DC
  Filled 2016-08-04: qty 1000

## 2016-08-04 MED ORDER — IBUPROFEN 600 MG PO TABS
600.0000 mg | ORAL_TABLET | Freq: Four times a day (QID) | ORAL | Status: DC
  Administered 2016-08-04 – 2016-08-06 (×10): 600 mg via ORAL
  Filled 2016-08-04 (×10): qty 1

## 2016-08-04 MED ORDER — ZOLPIDEM TARTRATE 5 MG PO TABS: 5.0000 mg | ORAL_TABLET | Freq: Every evening | ORAL | Status: DC | PRN

## 2016-08-04 MED ORDER — SIMETHICONE 80 MG PO CHEW
80.0000 mg | CHEWABLE_TABLET | ORAL | Status: DC | PRN
  Administered 2016-08-04: 80 mg via ORAL
  Filled 2016-08-04: qty 1

## 2016-08-04 MED ORDER — SOD CITRATE-CITRIC ACID 500-334 MG/5ML PO SOLN: 30.0000 mL | ORAL | Status: DC | PRN

## 2016-08-04 MED ORDER — COCONUT OIL OIL
1.0000 "application " | TOPICAL_OIL | Status: DC | PRN
  Administered 2016-08-06: 1 via TOPICAL
  Filled 2016-08-04: qty 120

## 2016-08-04 MED ORDER — FAMOTIDINE 20 MG PO TABS: 40.0000 mg | ORAL_TABLET | Freq: Once | ORAL | Status: DC

## 2016-08-04 MED ORDER — FENTANYL CITRATE (PF) 100 MCG/2ML IJ SOLN
INTRAMUSCULAR | Status: AC
  Administered 2016-08-04: 100 ug via INTRAVENOUS
  Filled 2016-08-04: qty 2

## 2016-08-04 MED ORDER — LACTATED RINGERS IV SOLN: 500.0000 mL | INTRAVENOUS | Status: DC | PRN

## 2016-08-04 MED ORDER — ACETAMINOPHEN 325 MG PO TABS: 650.0000 mg | ORAL_TABLET | ORAL | Status: DC | PRN

## 2016-08-04 MED ORDER — OXYTOCIN BOLUS FROM INFUSION
500.0000 mL | Freq: Once | INTRAVENOUS | Status: AC
  Administered 2016-08-04: 500 mL via INTRAVENOUS

## 2016-08-04 MED ORDER — DIBUCAINE 1 % RE OINT: 1.0000 "application " | TOPICAL_OINTMENT | RECTAL | Status: DC | PRN

## 2016-08-04 MED ORDER — FENTANYL 2.5 MCG/ML BUPIVACAINE 1/10 % EPIDURAL INFUSION (WH - ANES): 14.0000 mL/h | INTRAMUSCULAR | Status: DC | PRN

## 2016-08-04 MED ORDER — ONDANSETRON HCL 4 MG/2ML IJ SOLN: 4.0000 mg | INTRAMUSCULAR | Status: DC | PRN

## 2016-08-04 MED ORDER — PHENYLEPHRINE 40 MCG/ML (10ML) SYRINGE FOR IV PUSH (FOR BLOOD PRESSURE SUPPORT)
80.0000 ug | PREFILLED_SYRINGE | INTRAVENOUS | Status: DC | PRN
  Filled 2016-08-04: qty 5

## 2016-08-04 MED ORDER — ONDANSETRON HCL 4 MG PO TABS: 4.0000 mg | ORAL_TABLET | ORAL | Status: DC | PRN

## 2016-08-04 NOTE — Progress Notes (Signed)
CSW spoke with [facility] CPS worker, Corine ShelterVeronica DeLos Cobos to complete a CPS report.  CPS worker informed CSW that the report will be evaluated and at this time there are no barriers for dc.  If there is a change in dc planning for CPS, CPS will contact the hospital's social work department.   Blaine HamperAngel Boyd-Gilyard, MSW, LCSW Clinical Social Work [phone]

## 2016-08-04 NOTE — Progress Notes (Signed)
CSW attempted to make a CPS report with [facility] Department of Social Services.  CSW left a voicemail message and requested a return call.   At [facility] request, CSW provided [name] with a lunch voucher.   Blaine HamperAngel Boyd-Gilyard, MSW, LCSW Clinical Social Work [phone]

## 2016-08-04 NOTE — Progress Notes (Signed)
600 motrin po

## 2016-08-04 NOTE — Anesthesia Postprocedure Evaluation (Signed)
Anesthesia Post Note  Patient: Mary Hensley  Procedure(s) Performed: * No procedures listed *  Patient location during evaluation: [facility] Unit Anesthesia Type: Epidural Level of consciousness: awake and alert, oriented and patient cooperative Pain management: pain level controlled Vital Signs Assessment: post-procedure vital signs reviewed and stable Respiratory status: spontaneous breathing and nonlabored ventilation Cardiovascular status: stable Postop Assessment: no headache, no backache, patient able to bend at knees, no signs of nausea or vomiting and adequate PO intake Anesthetic complications: no Comments: Patient stated anesthesiologist was pleasant and placed epidural without difficulty. Patient was upset that she had to wait 3 hours from time of asking for epidural placement to actual placement time, and received no pain relief after placement because it was time for her to push. She had received IV Fentanyl for labor pain relief prior to epidural placement.      Last Vitals:  Vitals:   08/04/16 1835 08/04/16 2100  BP: 138/83 (!) 142/64  Pulse: 80 86  Resp: 18 19  Temp: 36.7 C 36.9 C    Last Pain:  Pain level now - 0  Pain Goal: Patients Stated Pain Goal: 4 (08/04/16 0830)               Dawsyn Ramsaran

## 2016-08-04 NOTE — Anesthesia Procedure Notes (Signed)
Epidural Patient location during procedure: OB Start time: 08/04/2016 5:38 AM End time: 08/04/2016 5:44 AM  Staffing Performed: anesthesiologist   Preanesthetic Checklist Completed: patient identified, site marked, surgical consent, pre-op evaluation, timeout performed, IV checked, risks and benefits discussed and monitors and equipment checked  Epidural Patient position: sitting Prep: site prepped and draped and DuraPrep Patient monitoring: continuous pulse ox and blood pressure Approach: midline Location: L4-L5 Injection technique: LOR air  Needle:  Needle type: Tuohy  Needle gauge: 17 G Needle length: 9 cm and 9 Needle insertion depth: 9 cm Catheter type: closed end flexible Catheter size: 19 Gauge Catheter at skin depth: 15 cm Test dose: negative  Assessment Events: blood not aspirated, injection not painful, no injection resistance, negative IV test and no paresthesia

## 2016-08-04 NOTE — Anesthesia Preprocedure Evaluation (Signed)
Anesthesia Evaluation  Patient identified by MRN, date of birth, ID band Patient awake    Reviewed: Allergy & Precautions, NPO status , Patient's Chart, lab work & pertinent test results  Airway Mallampati: III  TM Distance: >3 FB Neck ROM: Full    Dental no notable dental hx.    Pulmonary neg pulmonary ROS, former smoker,    Pulmonary exam normal        Cardiovascular hypertension, negative cardio ROS Normal cardiovascular exam     Neuro/Psych negative neurological ROS  negative psych ROS   GI/Hepatic negative GI ROS, Neg liver ROS,   Endo/Other  Morbid obesity  Renal/GU negative Renal ROS  negative genitourinary   Musculoskeletal negative musculoskeletal ROS (+)   Abdominal   Peds negative pediatric ROS (+)  Hematology negative hematology ROS (+)   Anesthesia Other Findings   Reproductive/Obstetrics negative OB ROS (+) Pregnancy                             Anesthesia Physical Anesthesia Plan  ASA: III  Anesthesia Plan: Epidural   Post-op Pain Management:    Induction:   Airway Management Planned:   Additional Equipment:   Intra-op Plan:   Post-operative Plan:   Informed Consent:   Plan Discussed with:   Anesthesia Plan Comments:         Anesthesia Quick Evaluation

## 2016-08-04 NOTE — Clinical Social Work Maternal (Signed)
CLINICAL SOCIAL WORK MATERNAL/CHILD NOTE  Patient Details  Name: Mary Hensley MRN: [id number] Date of Birth: July 30, 1986  Date:  08/04/2016  Clinical Social Worker Initiating Note:  Laurey Arrow Date/ Time Initiated:  08/04/16/1223     Child's Name:  Mary Hensley   Legal Guardian:  Mother   Need for Interpreter:  None   Date of Referral:  08/04/16     Reason for Referral:  Homelessness , Current Substance Use/Substance Use During Pregnancy    Referral Source:  NICU   Address:  [place]. [place] [address]  Phone number:  [phone]   Household Members:  Self, Parents, Minor Children, Significant Other   Natural Supports (not living in the home):  Immediate Family, Parent, Spouse/significant other   Professional Supports: None   Employment: Unemployed   Type of Work:     Education:  9 to 11 years   Pensions consultant:      Other Resources:  Food Stamps    Cultural/Religious Considerations Which May Impact Care:  Per [name]'s Face Sheet, [name] is [place] [employer]  Strengths:  Ability to meet basic needs , Home prepared for child    Risk Factors/Current Problems:  Substance Use    Cognitive State:  Alert , Linear Thinking , Able to Concentrate    Mood/Affect:  Calm , Comfortable , Relaxed    CSW Assessment: CSW met with [name] to complete an assessment for hx of substance use and homelessness. [name] introduced her room guest to CSW as [name] (Mary Hensley).  [name] gave CSW permission to meet with [name] while [name] was present. CSW inquired about [name]'s housing; [name] and [name] reported that they reside in [place] with [name]'s mother.  [name] and [name] denied being homeless or having any homeless concerns. However, both parents acknowledged transportation barriers. CSW assessed for transportation needs prior to dc and both parents reported that [name]'s mother would be picking up the family and will be providing the infant with a car seat. [name] inquired  about a taxi voucher in the event that [name]'s mother is unable to provide transportation.  CSW encouraged the family to reach out to the weekend CSW and have the CSW to assess the family for transportation needs. CSW inquired about [name] substance use, and [name] acknowledged the use of substance throughout pregnancy.  [name] reported her last use of marijuana being 2 days ago, last use of cocaine being 2 months ago, and use of pain medication ([name] does not have a prescription) throughout pregnancy. CSW thanked [name] for being honest and informed [name] and [name] of the hospital's drug screen policy. [name] and [name] were made of aware of the drug screenings for the infant. [name] denies any hx of CPS involvement and was understanding of the hospital's policy. CSW made the parents aware of the CPS report that CSW will be making as it relates to [name] substance use throughout pregnancy; both parents were understanding and had no concerns. CSW offered [name] resources and referrals for substance abuse; however, [name] declined all information. CSW also educated [name] and [name] about PPD.  CSW informed [name] of possible supports and interventions to decrease PPD and reviewed supports for [name]. CSW also encouraged [name] to seek medical attention if needed for increased signs and symptoms for PPD. CSW reviewed safe sleep and SIDS. [name] and [name] was knowledgeable, and [name] asked appropriate questions. CSW thanked both parents for meeting with CSW and encouraged parents to contact CSW if needed.  CSW Plan/Description:  Patient/Family Education , Child Protective Service Report , Information/Referral to [place], MSW, [employer] Social Work [phone]  Dimple Nanas, LCSW 08/04/2016, 12:29 PM

## 2016-08-04 NOTE — H&P (Signed)
LABOR AND DELIVERY ADMISSION HISTORY AND PHYSICAL NOTE  LYRIQ FINERTY is a 30 y.o. female [phone] with IUP at [redacted]w[redacted]d by [redacted]w[redacted]d [place] presenting for SOL.   She reports positive fetal movement. She denies leakage of fluid or vaginal bleeding. She reports contractions every 3-5 minutes.   Prenatal History/Complications: Limited prenatal care, homeless Drug use (cannabinoids on previous UDS) Chronic hypertension  Past Medical History: Past Medical History:  Diagnosis Date  . Cholelithiasis    Diagnosed in 2013  . Chronic hypertension    No medications  . Morbid obesity (HCC)     Past Surgical History: Past Surgical History:  Procedure Laterality Date  . NO PAST SURGERIES      Obstetrical History: OB History    Gravida Para Term Preterm AB Living   5 2 2  0 2 2   SAB TAB Ectopic Multiple Live Births   2     0 2     Social History: Social History   Social History  . Marital status: Single    Spouse name: N/A  . Number of children: N/A  . Years of education: N/A   Social History Main Topics  . Smoking status: Former Smoker    Quit date: 11/27/2015  . Smokeless tobacco: Never Used  . Alcohol use No  . Drug use:     Frequency: 1.0 time per week    Types: Marijuana  . Sexual activity: Yes   Other Topics Concern  . Not on file   Social History Narrative  . No narrative on file    Family History: Family History  Problem Relation Age of Onset  . Ovarian cancer Mother   . Diabetes Father   . Hypertension Father   . Heart disease Father     Allergies: Allergies  Allergen Reactions  . Penicillins Hives    Prescriptions Prior to Admission  Medication Sig Dispense Refill Last Dose  . Prenatal Vit-Fe Fumarate-FA (PRENATAL VITAMIN) 27-0.8 MG TABS Take 1 tablet by mouth daily. 30 tablet 12 08/03/2016 at Unknown time     Review of Systems   All systems reviewed and negative except as stated in HPI  Blood pressure 143/87, pulse 107, temperature 98.1 F (36.7  C), temperature source Oral, resp. rate 20, SpO2 100 %, unknown if currently breastfeeding. General appearance: alert, cooperative and no distress Lungs: no respiratory distress Heart: regular rate Abdomen: soft, non-tender Extremities: No calf swelling or tenderness Presentation: cephalic by cervical exam Fetal monitoring: 130 baseline, moderate variability, + accels, 1 late decel Uterine activity: ctx q3-5 mins Dilation: 6.5 Effacement (%): 90 Exam by:: Sherin Quarry, CNM   Prenatal labs: ABO, Rh: --/--/O POS (06/19 0551) Antibody: NEG (06/19 0551) Rubella: Immune RPR: Non Reactive (06/10 1555)  HBsAg: Negative (06/10 1605)  HIV: Non Reactive (06/10 1605)  GBS: Negative (08/25 0000)  Genetic screening: Declined Anatomy US: Wnl; Initially with di/di twins ([redacted]w[redacted]d), demise of second embryo by [place] at [redacted]w[redacted]d  Prenatal Transfer Tool  Maternal Diabetes: No Genetic Screening: Declined Maternal Ultrasounds/Referrals: Normal Fetal Ultrasounds or other Referrals:  None Maternal Substance Abuse:  Yes:  Type: Marijuana Significant Maternal Medications:  None Significant Maternal Lab Results: Lab values include: Group B Strep negative  No results found for this or any previous visit (from the past 24 hour(s)).  Patient Active Problem List   Diagnosis Date Noted  . Active labor 08/04/2016  . Morbid obesity (HCC) 05/22/2016  . Chronic hypertension during pregnancy, antepartum 05/22/2016  . History of  inadequate prenatal care 05/22/2016  . Homelessness 05/22/2016  . Anemia 05/22/2016  . Nausea and vomiting of pregnancy, antepartum 05/21/2016  . Abdominal cramping affecting pregnancy 05/14/2016    Assessment: Clare Charonerri D Giannotti is a 30 y.o. [id number] at 8161w2d here for SOL.  #labor: Expectant management, SVD #pain: Epidural #FWB: Category 1 #ID: GBS negative #MOF: Bottle #MOC: Depo injection #Circ: N/A Social work consult after delivery  CRESENZO-DISHMAN,Purity Irmen   Ambrose FinlandGabriela E Reed,  Medical Student  9/1/20174:13 AM

## 2016-08-04 NOTE — Anesthesia Pain Management Evaluation Note (Signed)
  CRNA Pain Management Visit Note  Patient: Mary Hensley, 30 y.o., female  "Hello I am a member of the anesthesia team at [facility]. We have an anesthesia team available at all times to provide care throughout the hospital, including epidural management and anesthesia for C-section. I don't know your plan for the delivery whether it a natural birth, water birth, IV sedation, nitrous supplementation, doula or epidural, but we want to meet your pain goals."   1.Was your pain managed to your expectations on prior hospitalizations?   Yes   2.What is your expectation for pain management during this hospitalization?     Epidural  3.How can we help you reach that goal? epidural  Record the patient's initial score and the patient's pain goal.   Pain: 10  Pain Goal: 10 [facility] wants you to be able to say your pain was always managed very well.  Mary Hensley 08/04/2016

## 2016-08-05 ENCOUNTER — Encounter (HOSPITAL_COMMUNITY): Payer: Self-pay | Admitting: *Deleted

## 2016-08-05 LAB — CBC
HEMATOCRIT: 28.3 % — AB (ref 36.0–46.0)
HEMOGLOBIN: 8.8 g/dL — AB (ref 12.0–15.0)
MCH: 22.1 pg — AB (ref 26.0–34.0)
MCHC: 31.1 g/dL (ref 30.0–36.0)
MCV: 70.9 fL — ABNORMAL LOW (ref 78.0–100.0)
Platelets: 243 10*3/uL (ref 150–400)
RBC: 3.99 MIL/uL (ref 3.87–5.11)
RDW: 17.5 % — AB (ref 11.5–15.5)
WBC: 8.3 10*3/uL (ref 4.0–10.5)

## 2016-08-05 LAB — RPR: RPR Ser Ql: NONREACTIVE

## 2016-08-05 NOTE — Progress Notes (Signed)
Post Partum Day 1 Subjective: no complaints, voiding, tolerating PO and + flatus Social work to help today with d/c followup in [place]. [place]renatal care was in [place], no PNC since july BC-DepoProvera  Objective: Blood pressure (!) 122/54, pulse 72, temperature 98 F (36.7 C), resp. rate 20, height 5\' 7"  (1.702 m), weight (!) 326 lb (147.9 kg), SpO2 100 %, unknown if currently breastfeeding.  Physical Exam:  General: alert, cooperative, no distress and morbidly obese Lochia: appropriate Uterine Fundus: appropriate under pannus Incision:  DVT Evaluation: No evidence of DVT seen on physical exam.   Recent Labs  08/04/16 0445 08/05/16 0515  HGB 10.3* 8.8*  HCT 32.1* 28.3*    Assessment/Plan: Plan for discharge tomorrow and Contraception depo bottle feeding SW to help with getting information on pp care options in [place]   LOS: 1 day   Telia Amundson V 08/05/2016, 8:14 AM

## 2016-08-06 MED ORDER — MEDROXYPROGESTERONE ACETATE 150 MG/ML IM SUSP
150.0000 mg | Freq: Once | INTRAMUSCULAR | Status: AC
Start: 2016-08-06 — End: 2016-08-06
  Administered 2016-08-06: 150 mg via INTRAMUSCULAR
  Filled 2016-08-06: qty 1

## 2016-08-06 MED ORDER — IBUPROFEN 600 MG PO TABS: 600.0000 mg | ORAL_TABLET | Freq: Four times a day (QID) | ORAL | 0 refills | Status: AC

## 2016-08-06 NOTE — Lactation Note (Signed)
This note was copied from a baby's chart. Lactation Consultation Note  Patient Name: [name] ZOXWR'UToday's Date: 08/06/2016   Spoke with mother. She reports she does not plan to breast feed. Asked her is she was aware of benefits of breastfeeding her infant she said yes and that she wanted to bottle feed.      Maternal Data    Feeding    LATCH Score/Interventions                      Lactation Tools Discussed/Used     Consult Status      Ed BlalockSharon S Damita Eppard 08/06/2016, 11:18 AM

## 2016-08-06 NOTE — Progress Notes (Signed)
Spoke with Social Worker,Hannah about pt for discharge. She stated that pt is cleared for discharge & will be going to live with her mother.

## 2016-08-06 NOTE — Lactation Note (Signed)
This note was copied from a baby's chart. Lactation Consultation Note  Patient Name: Girl Aaliayah Miao ETIJF'T Date: 08/06/2016 Reason for consult: Follow-up assessment;NICU baby   Follow up with mom of 56 hour old infant. Mom reports she is now planning to breastfeed her infant. Mom reports she is pumping with DEBP and is getting 25 cc/pumping. Discussed with mom to change to maintenance setting when she pumps 20 cc/pumping x 3. Mom voiced understanding.   Hand Expression Handout, BF Resources, and Providing Milk for Your Baby in NICU Booklet given. Reviewed pumping schedule, milk coming to volume and BM Storage. MOm report she knows how to hand express.    Mom plans to apply for [facility]. She declined pump rental and wants to use manual pump. Showed mom how to use manual pump in pump kit to pump single or double. Mom reports she used a hand pump in the past. Enc her to use DEBP when visiting infant in NICU. Mom reports she may be able to put infant to breast this evening. She is aware of Lactation Services. Reviewed engorgement prevention/treatment with mom in [place] of Your Baby and Me Booklet.   [facility] Brochure given, mom is aware of OP Services, BF Support Groups and [place] phone #. Enc mom to call for questions/concerns prn. Will follow up in NICU prn. Parents report infant may be ready to go home as early as Tuesday.    Maternal Data Has patient been taught Hand Expression?: Yes Does the patient have breastfeeding experience prior to this delivery?: Yes  Feeding    LATCH Score/Interventions                      Lactation Tools Discussed/Used WIC Program: No (Plans to apply) Pump Review: Setup, frequency, and cleaning;Milk Storage Initiated by:: Reviewed   Consult Status Consult Status: PRN Follow-up type: Call as needed    Donn Pierini 08/06/2016, 12:01 PM

## 2016-08-06 NOTE — Progress Notes (Signed)
LCSW following and called by RN for transportation issues. [name] and [name] in room, report they have transportation for home, but will have difficulty coming to and from the hospital to see baby in NICU. LCSW explained use of gas cards and gave [name] a 10 dollar gas card to be able to transport.  [name] was appreciative as she can get someone to drive her, but did not have the funds.   [name] reports they may be able to stay in [place] area while baby is in NICU with friends. If [name] can remain in [place], we can supply bus passes and will assess need.  LCSW will leave note for weekday CSW to follow up with family next week. [name] is hopeful for baby to come home in next few days. She processes emotions regarding baby remaining in hospital and her discharge, but understands medical necessity and will be here as much as she can for baby.  Deretha EmoryHannah Dalyce Renne LCSW, MSW Clinical Social Work: System Insurance underwriterWide Float Coverage for [employer] NICU Clinical social worker [phone]

## 2016-08-06 NOTE — Discharge Summary (Signed)
OB Discharge Summary  Patient Name: Mary Hensley DOB: June 07, 1986 MRN: [id number]  Date of admission: 08/04/2016 Delivering MD: Lynda Rainwater ANN   Date of discharge: 08/06/2016  Admitting diagnosis: 38 WEEKS CTX Intrauterine pregnancy: [redacted]w[redacted]d     Secondary diagnosis:Active Problems:   Active labor  Additional problems:none     Discharge diagnosis: Term Pregnancy Delivered and CHTN                                                                     Post partum procedures:n/a  Augmentation: n/a  Complications: None  Hospital course:  Onset of Labor With Vaginal Delivery     30 y.o. yo [id number] at [redacted]w[redacted]d was admitted in Active Labor on 08/04/2016. Patient had an uncomplicated labor course as follows:  Membrane Rupture Time/Date: 5:46 AM ,08/04/2016   Intrapartum Procedures: Episiotomy: None [1]                                         Lacerations:  None [1]  Patient had a delivery of a Viable infant. 08/04/2016  Information for the patient's newborn:  Floretta, Petro [[id number]]  Delivery Method: Vaginal, Spontaneous Delivery (Filed from Delivery Summary)    Pateint had an uncomplicated postpartum course.  She is ambulating, tolerating a regular diet, passing flatus, and urinating well. Patient is discharged home in stable condition on 08/06/16.    Physical exam Vitals:   08/05/16 1215 08/05/16 1909 08/05/16 2121 08/06/16 0530  BP: 121/71 127/76 126/68 (!) 108/58  Pulse: 78 91 92 64  Resp: 18 18 20 16   Temp: 98.3 F (36.8 C) 98.7 F (37.1 C) 98.5 F (36.9 C) 98.3 F (36.8 C)  TempSrc: Oral Oral Oral Oral  SpO2: 100% 100% 100% 100%  Weight:      Height:       General: alert, cooperative and no distress Lochia: appropriate Uterine Fundus: firm Incision: N/A DVT Evaluation: No evidence of DVT seen on physical exam. Negative Homan's sign. Labs: Lab Results  Component Value Date   WBC 8.3 08/05/2016   HGB 8.8 (L) 08/05/2016   HCT 28.3 (L) 08/05/2016   MCV  70.9 (L) 08/05/2016   PLT 243 08/05/2016   CMP Latest Ref Rng & Units 07/28/2016  Glucose 65 - 99 mg/dL 87  BUN 6 - 20 mg/dL 5(L)  Creatinine 1.30 - 1.00 mg/dL 8.65  Sodium 784 - 696 mmol/L 135  Potassium 3.5 - 5.1 mmol/L 3.8  Chloride 101 - 111 mmol/L 109  CO2 22 - 32 mmol/L 21(L)  Calcium 8.9 - 10.3 mg/dL 9.1  Total Protein 6.5 - 8.1 g/dL 6.0(L)  Total Bilirubin 0.3 - 1.2 mg/dL 0.5  Alkaline Phos 38 - 126 U/L 146(H)  AST 15 - 41 U/L 14(L)  ALT 14 - 54 U/L 9(L)    Discharge instruction: per After Visit Summary and "Baby and Me Booklet".    Diet: routine diet  Activity: Advance as tolerated. Pelvic rest for 6 weeks.   Outpatient follow up:6 weeks Follow up Appt:No future appointments. Follow up visit: No Follow-up on file.  Postpartum contraception: Depo Provera  Newborn  Data: Live born female  Birth Weight: 7 lb 10.8 oz (3480 g) APGAR: 7, 7  Baby Feeding: Bottle and Breast Disposition:NICU   08/06/2016 Wyvonnia DuskyMarie Lawson, CNM

## 2016-09-19 ENCOUNTER — Ambulatory Visit: Payer: Self-pay | Admitting: Advanced Practice Midwife

## 2016-09-28 NOTE — Progress Notes (Signed)
[facility] for a positive cord screen for infant and [name] failing to attend scheduled genetic counseling and CDSA appointments for infant.    Blaine HamperAngel Boyd-Gilyard, MSW, LCSW Clinical Social Work [phone]

## 2016-11-23 ENCOUNTER — Encounter (HOSPITAL_COMMUNITY): Payer: Self-pay | Admitting: Emergency Medicine

## 2016-11-23 ENCOUNTER — Emergency Department (HOSPITAL_COMMUNITY)
Admission: EM | Admit: 2016-11-23 | Discharge: 2016-11-23 | Disposition: A | Discharge status: Home / Self Care | Payer: Medicaid Other | Attending: Emergency Medicine | Admitting: Emergency Medicine

## 2016-11-23 DIAGNOSIS — Z79899 Other long term (current) drug therapy: Secondary | ICD-10-CM | POA: Insufficient documentation

## 2016-11-23 DIAGNOSIS — I10 Essential (primary) hypertension: Secondary | ICD-10-CM | POA: Insufficient documentation

## 2016-11-23 DIAGNOSIS — K0889 Other specified disorders of teeth and supporting structures: Secondary | ICD-10-CM | POA: Insufficient documentation

## 2016-11-23 DIAGNOSIS — Z87891 Personal history of nicotine dependence: Secondary | ICD-10-CM | POA: Insufficient documentation

## 2016-11-23 MED ORDER — BUPIVACAINE-EPINEPHRINE (PF) 0.5% -1:200000 IJ SOLN
1.8000 mL | Freq: Once | INTRAMUSCULAR | Status: AC
  Administered 2016-11-23: 1.8 mL
  Filled 2016-11-23: qty 1.8

## 2016-11-23 MED ORDER — CLINDAMYCIN HCL 150 MG PO CAPS: 450.0000 mg | ORAL_CAPSULE | Freq: Three times a day (TID) | ORAL | 0 refills | Status: AC

## 2016-11-23 MED ORDER — NAPROXEN 500 MG PO TABS: 500.0000 mg | ORAL_TABLET | Freq: Two times a day (BID) | ORAL | 0 refills | Status: AC

## 2016-11-23 MED ORDER — LIDOCAINE VISCOUS 2 % MT SOLN: 15.0000 mL | OROMUCOSAL | 0 refills | Status: AC | PRN

## 2016-11-23 NOTE — ED Provider Notes (Signed)
[facility]-EMERGENCY DEPT Provider Note   CSN: [id number] Arrival date & time: 11/23/16  1452  By signing my name below, I, Mary Hensley, attest that this documentation has been prepared under the direction and in the presence of Mary C. Joy, PA-C. Electronically Signed: Placido SouLogan Hensley, ED Scribe. 11/23/16. 3:38 PM.   History   Chief Complaint No chief complaint on file.  HPI HPI Comments: Mary Hensley is a 30 y.o. female who presents to the Emergency Department complaining of constant, mild, left upper dental pain x 2 weeks that began worsening yesterday. Pt made an appointment with her dentist in 8 days and was instructed to come to the ED "in case she has an infection."  She reports associated difficulty sleeping due to pain. Her pain worsens when chewing. She has an allergy to penicillin and amoxicillin. Denies fevers/chills, nausea/vomiting, difficulty breathing or swallowing, or any other complaints.   The history is provided by the patient and medical records. No language interpreter was used.    Past Medical History:  Diagnosis Date  . Cholelithiasis    Diagnosed in 2013  . Chronic hypertension    No medications  . Morbid obesity [facility](HCC)     Patient Active Problem List   Diagnosis Date Noted  . Active labor 08/04/2016  . Morbid obesity (HCC) 05/22/2016  . Chronic hypertension during pregnancy, antepartum 05/22/2016  . History of inadequate prenatal care 05/22/2016  . Homelessness 05/22/2016  . Anemia 05/22/2016  . Nausea and vomiting of pregnancy, antepartum 05/21/2016  . Abdominal cramping affecting pregnancy 05/14/2016    Past Surgical History:  Procedure Laterality Date  . NO PAST SURGERIES      OB History    Gravida Para Term Preterm AB Living   5 3 3  0 2 3   SAB TAB Ectopic Multiple Live Births   2     0 3       Home Medications    Prior to Admission medications   Medication Sig Start Date End Date Taking? Authorizing Provider  clindamycin  (CLEOCIN) 150 MG capsule Take 3 capsules (450 mg total) by mouth 3 (three) times daily. 11/23/16 11/30/16  Mary C Joy, PA-C  ibuprofen (ADVIL,MOTRIN) 600 MG tablet Take 1 tablet (600 mg total) by mouth every 6 (six) hours. 08/06/16   Montez MoritaMarie D Lawson, CNM  lidocaine (XYLOCAINE) 2 % solution Use as directed 15 mLs in the mouth or throat as needed for mouth pain. 11/23/16   Mary C Joy, PA-C  naproxen (NAPROSYN) 500 MG tablet Take 1 tablet (500 mg total) by mouth 2 (two) times daily. 11/23/16   Mary C Joy, PA-C  Prenatal Vit-Fe Fumarate-FA (PRENATAL VITAMIN) 27-0.8 MG TABS Take 1 tablet by mouth daily. 05/15/16   Nadara Mustardobert P Harris, MD    Family History Family History  Problem Relation Age of Onset  . Ovarian cancer Mother   . Diabetes Father   . Hypertension Father   . Heart disease Father     Social History Social History  Substance Use Topics  . Smoking status: Former Smoker    Quit date: 11/27/2015  . Smokeless tobacco: Never Used  . Alcohol use No     Allergies   Shellfish allergy and Penicillins   Review of Systems Review of Systems  Constitutional: Negative for chills and fever.  HENT: Positive for dental problem. Negative for trouble swallowing and voice change.   Respiratory: Negative for shortness of breath.   Psychiatric/Behavioral: Positive for sleep disturbance.  Physical Exam Updated Vital Signs BP 155/79 (BP Location: Right Arm)   Pulse 83   Temp 98.8 F (37.1 C) (Oral)   Resp 18   SpO2 95%   Physical Exam  Constitutional: She appears well-developed and well-nourished. No distress.  HENT:  Head: Normocephalic and atraumatic.  Tenderness to the left maxillary rear most molar. No area of fluctuance to suggest an abscess. Readily handles oral secretions. No trismus. Mouth opening to at least three finger widths.   Eyes: Conjunctivae are normal.  Neck: Neck supple.  Cardiovascular: Normal rate and regular rhythm.   Pulmonary/Chest: Effort normal.    Neurological: She is alert.  Skin: Skin is warm and dry. She is not diaphoretic.  Psychiatric: She has a normal mood and affect. Her behavior is normal.  Nursing note and vitals reviewed.  ED Treatments / Results  Labs (all labs ordered are listed, but only abnormal results are displayed) Labs Reviewed - No data to display  EKG  EKG Interpretation None       Radiology No results found.  Procedures Dental Block Date/Time: 11/23/2016 3:58 PM Performed by: Anselm PancoastJOY, Mary C Authorized by: Anselm PancoastJOY, Mary C   Consent:    Consent obtained:  Verbal   Consent given by:  Patient   Risks discussed:  Unsuccessful block Indications:    Indications: dental pain   Location:    Block type:  Posterior superior alveolar   Laterality:  Left Procedure details (see MAR for exact dosages):    Anesthetic injected:  Bupivacaine 0.5% WITH epi   Injection procedure:  Anatomic landmarks identified Post-procedure details:    Outcome:  Pain relieved   Patient tolerance of procedure:  Tolerated well, no immediate complications      COORDINATION OF CARE: 3:36 PM Discussed next steps with pt. Pt verbalized understanding and is agreeable with the plan.    Medications Ordered in ED Medications  bupivacaine-epinephrine (MARCAINE W/ EPI) 0.5% -1:200000 injection 1.8 mL (1.8 mLs Infiltration Given by Other 11/23/16 1556)    Initial Impression / Assessment and Plan / ED Course  I have reviewed the triage vital signs and the nursing notes.  Pertinent labs & imaging results that were available during my care of the patient were reviewed by me and considered in my medical decision making (see chart for details).  Clinical Course    Patient presents with dental pain. No signs of sepsis or Ludwig's angioedema. Dental follow-up encouraged. Return precautions discussed.  Patient states she is not breast-feeding.   I personally performed the services described in this documentation, which was scribed in  my presence. The recorded information has been reviewed and is accurate. Final Clinical Impressions(s) / ED Diagnoses   Final diagnoses:  Pain, dental    New Prescriptions Discharge Medication List as of 11/23/2016  4:08 PM    START taking these medications   Details  clindamycin (CLEOCIN) 150 MG capsule Take 3 capsules (450 mg total) by mouth 3 (three) times daily., Starting Thu 11/23/2016, Until Thu 11/30/2016, Print    lidocaine (XYLOCAINE) 2 % solution Use as directed 15 mLs in the mouth or throat as needed for mouth pain., Starting Thu 11/23/2016, Print    naproxen (NAPROSYN) 500 MG tablet Take 1 tablet (500 mg total) by mouth 2 (two) times daily., Starting Thu 11/23/2016, Print         Anselm PancoastShawn C Joy, PA-C 11/24/16 1042    Pricilla LovelessScott Goldston, MD 11/25/16 2350

## 2016-11-23 NOTE — Discharge Instructions (Signed)
You have been seen today for dental pain. You should follow up with a dentist as soon as possible. This problem will not resolve on its own without the care of a dentist. Use ibuprofen or naproxen for pain. Use the viscous lidocaine for mouth pain. Swish with the lidocaine and spit it out. Do not swallow it. ° °Dental Resource Guide ° °[facility] °[address], [address] °[place], [place] [address] °[phone] ° °[facility] °[address] °[place], [place] [address] °[phone] ° °[facility] °[address] °[place], [place] [address] °[phone] ext. [phone] ° °[facility] °[address] °[place], [place] [address] °[phone] ° °[facility] °[address] °[place], [place] [address] °[phone] ° °[employer] °Www.[facility].[employer].edu/patientcare/studentclinics/becomepatient ° °[employer] °[address] °[place], [place] [address] °[phone] ° °Website for free, low-income, or sliding scale dental services in [place]: °www.freedental.us ° °To find a dentist in [place] and surrounding areas: °www.ncdental.org/for-the-public/find-a-dentist ° °[facility] °http://www.ncdental.org/meetings-events/[place] ° °[place] [facility] °https://dma.ncdhhs.gov/find-a-doctor/medicaid-dental-providers ° °

## 2016-11-23 NOTE — ED Triage Notes (Addendum)
Pt reports 10/10 pain on the entire left side of her face. Pt suspects pain stems from the cracked tooth on the upper left side. Pain has been present for 2 weeks but became worse last night. Pt reports taking tylenol, ibuprofen, excedrin, and orajel mouth wash.

## 2017-06-04 ENCOUNTER — Encounter: Payer: Self-pay | Admitting: Emergency Medicine

## 2017-06-04 ENCOUNTER — Emergency Department
Admission: EM | Admit: 2017-06-04 | Discharge: 2017-06-04 | Disposition: A | Discharge status: Home / Self Care | Payer: Medicaid Other | Attending: Emergency Medicine | Admitting: Emergency Medicine

## 2017-06-04 DIAGNOSIS — J00 Acute nasopharyngitis [common cold]: Secondary | ICD-10-CM

## 2017-06-04 DIAGNOSIS — J029 Acute pharyngitis, unspecified: Secondary | ICD-10-CM | POA: Insufficient documentation

## 2017-06-04 DIAGNOSIS — R059 Cough, unspecified: Secondary | ICD-10-CM

## 2017-06-04 DIAGNOSIS — I10 Essential (primary) hypertension: Secondary | ICD-10-CM | POA: Insufficient documentation

## 2017-06-04 DIAGNOSIS — R05 Cough: Secondary | ICD-10-CM | POA: Insufficient documentation

## 2017-06-04 DIAGNOSIS — Z791 Long term (current) use of non-steroidal anti-inflammatories (NSAID): Secondary | ICD-10-CM | POA: Insufficient documentation

## 2017-06-04 DIAGNOSIS — Z87891 Personal history of nicotine dependence: Secondary | ICD-10-CM | POA: Insufficient documentation

## 2017-06-04 MED ORDER — ACETAMINOPHEN-CODEINE #3 300-30 MG PO TABS: 1.0000 | ORAL_TABLET | Freq: Four times a day (QID) | ORAL | 0 refills | Status: AC | PRN

## 2017-06-04 MED ORDER — ONDANSETRON 8 MG PO TBDP
8.0000 mg | ORAL_TABLET | Freq: Once | ORAL | Status: AC
  Administered 2017-06-04: 8 mg via ORAL
  Filled 2017-06-04: qty 1

## 2017-06-04 MED ORDER — BENZONATATE 100 MG PO CAPS: 100.0000 mg | ORAL_CAPSULE | Freq: Three times a day (TID) | ORAL | 0 refills | Status: AC | PRN

## 2017-06-04 MED ORDER — ACETAMINOPHEN-CODEINE #3 300-30 MG PO TABS
1.0000 | ORAL_TABLET | Freq: Once | ORAL | Status: AC
  Administered 2017-06-04: 1 via ORAL
  Filled 2017-06-04: qty 1

## 2017-06-04 MED ORDER — AZITHROMYCIN 250 MG PO TABS: ORAL_TABLET | ORAL | 0 refills | Status: AC

## 2017-06-04 NOTE — ED Notes (Signed)
Tylenol with codeine given for cough, not pain

## 2017-06-04 NOTE — ED Triage Notes (Signed)
Presents with cough for several days   Unsure of fever but states cough is prod   Also having some discomfort in chest and sore throat with cough

## 2017-06-04 NOTE — ED Provider Notes (Signed)
[facility] Emergency Department Provider Note   ____________________________________________   I have reviewed the triage vital signs and the nursing notes.   HISTORY  Chief Complaint Cough    HPI Mary Hensley is a 31 y.o. female presents with a cough going on for 3-4 days with a sore throat. Patient was actively coughing with vomiting during the assessment. Patient reported that that had been occurring since the onset of the cough. Patient denied any sick contacts, recent illnesses and no associated fever with current symptoms. Patient denied   Patient denies fever, chills, headache, vision changes, chest pain, chest tightness, shortness of breath, abdominal pain, nausea and vomiting.  Past Medical History:  Diagnosis Date  . Cholelithiasis    Diagnosed in 2013  . Chronic hypertension    No medications  . Morbid obesity [facility])     Patient Active Problem List   Diagnosis Date Noted  . Active labor 08/04/2016  . Morbid obesity (HCC) 05/22/2016  . Chronic hypertension during pregnancy, antepartum 05/22/2016  . History of inadequate prenatal care 05/22/2016  . Homelessness 05/22/2016  . Anemia 05/22/2016  . Nausea and vomiting of pregnancy, antepartum 05/21/2016  . Abdominal cramping affecting pregnancy 05/14/2016    Past Surgical History:  Procedure Laterality Date  . NO PAST SURGERIES      Prior to Admission medications   Medication Sig Start Date End Date Taking? Authorizing Provider  acetaminophen-codeine (TYLENOL #3) 300-30 MG tablet Take 1 tablet by mouth every 6 (six) hours as needed for moderate pain. 06/04/17   Idris Edmundson M, PA-C  azithromycin (ZITHROMAX Z-PAK) 250 MG tablet Take 2 tablets (500 mg) on  Day 1,  followed by 1 tablet (250 mg) once daily on Days 2 through 5. 06/04/17 06/09/17  Tai Skelly M, PA-C  benzonatate (TESSALON PERLES) 100 MG capsule Take 1 capsule (100 mg total) by mouth 3 (three) times daily as needed for  cough. 06/04/17 06/04/18  Lilias Lorensen M, PA-C  ibuprofen (ADVIL,MOTRIN) 600 MG tablet Take 1 tablet (600 mg total) by mouth every 6 (six) hours. 08/06/16   Montez Morita, CNM  lidocaine (XYLOCAINE) 2 % solution Use as directed 15 mLs in the mouth or throat as needed for mouth pain. 11/23/16   Joy, Shawn C, PA-C  naproxen (NAPROSYN) 500 MG tablet Take 1 tablet (500 mg total) by mouth 2 (two) times daily. 11/23/16   Joy, Shawn C, PA-C  Prenatal Vit-Fe Fumarate-FA (PRENATAL VITAMIN) 27-0.8 MG TABS Take 1 tablet by mouth daily. 05/15/16   Nadara Mustard, MD    Allergies Shellfish allergy and Penicillins  Family History  Problem Relation Age of Onset  . Ovarian cancer Mother   . Diabetes Father   . Hypertension Father   . Heart disease Father     Social History Social History  Substance Use Topics  . Smoking status: Former Smoker    Quit date: 11/27/2015  . Smokeless tobacco: Never Used  . Alcohol use No    Review of Systems Constitutional: Negative for fever/chills Eyes: No visual changes. ENT:  Positive for sore throat Cardiovascular: Denies chest pain. Respiratory: Persistent cough that elicits vomiting. Gastrointestinal: No abdominal pain.  Positive for vomiting. Negative for diarrhea. Skin: Negative for rash. Neurological: Negative for headaches.  ____________________________________________   PHYSICAL EXAM:  VITAL SIGNS: ED Triage Vitals  Enc Vitals Group     BP 06/04/17 1558 (!) 147/99     Pulse Rate 06/04/17 1558 90  Resp 06/04/17 1558 20     Temp 06/04/17 1558 98.5 F (36.9 C)     Temp Source 06/04/17 1558 Oral     SpO2 06/04/17 1558 100 %     Weight 06/04/17 1556 (!) 350 lb (158.8 kg)     Height 06/04/17 1556 5\' 6"  (1.676 m)     Head Circumference --      Peak Flow --      Pain Score 06/04/17 1556 9     Pain Loc --      Pain Edu? --      Excl. in GC? --     Constitutional: Alert and oriented. Well appearing and in no acute distress.  Head:  Normocephalic and atraumatic. Eyes: Conjunctivae are normal. PERRL.  Nose: Congestion and rhinorrhea Mouth/Throat: Mucous membranes are moist. Oropharynx clear without erythema. Persistent cough with mucus production and elicits vomiting on occasion. Neck: Supple.  Hematological/Lymphatic/Immunological: No cervical lymphadenopathy. Cardiovascular: Normal rate, regular rhythm. Normal distal pulses. Respiratory: Normal respiratory effort. No wheezes/rales/rhonchi. Lungs CTAB Musculoskeletal: Nontender with normal range of motion in all extremities. Neurologic: Normal speech and language.  Skin:  Skin is warm, dry and intact. No rash noted. Psychiatric: Mood and affect are normal.  ____________________________________________   LABS (all labs ordered are listed, but only abnormal results are displayed)  Labs Reviewed - No data to display ____________________________________________  EKG none ____________________________________________  RADIOLOGY none ____________________________________________   PROCEDURES  Procedure(s) performed: no    Critical Care performed: no ____________________________________________   INITIAL IMPRESSION / ASSESSMENT AND PLAN / ED COURSE  Pertinent labs & imaging results that were available during my care of the patient were reviewed by me and considered in my medical decision making (see chart for details).   Patient presents to the emergency department sore throat, cough, and congestion.Marland Kitchen History, physical exam are reassuring symptoms are consistent with his a pharyngitis with persistent cough.. Patient will be prescribed azithromycin for antibody coverage. Patient noted once the Zofran suppressed her vomiting the codeine significantly helped her coughing. Patient will be given codeine and Tessalon Perles to manage her cough. Discussed cough management and advised her to transition to the [facility] or over-the-counter cough medication as  the cough improved. Patient will also be encourage to rest, and rehydrate as a part of supportive care. Reassessment of vital signs were reassuring at discharge. Patient informed of clinical course, understand medical decision-making process, and agree with plan.  Patient was advised to follow up with PCP as needed and was also advised to return to the emergency department for symptoms that change or worsen.      If controlled substance prescribed during this visit, 12 month history viewed on the [facility] prior to issuing an initial prescription for Schedule II or III opiod. ____________________________________________   FINAL CLINICAL IMPRESSION(S) / ED DIAGNOSES  Final diagnoses:  Cough  Acute nasopharyngitis       NEW MEDICATIONS STARTED DURING THIS VISIT:  Discharge Medication List as of 06/04/2017  6:27 PM    START taking these medications   Details  acetaminophen-codeine (TYLENOL #3) 300-30 MG tablet Take 1 tablet by mouth every 6 (six) hours as needed for moderate pain., Starting Mon 06/04/2017, Print    azithromycin (ZITHROMAX Z-PAK) 250 MG tablet Take 2 tablets (500 mg) on  Day 1,  followed by 1 tablet (250 mg) once daily on Days 2 through 5., Print    benzonatate (TESSALON PERLES) 100 MG capsule Take 1 capsule (100 mg total) by  mouth 3 (three) times daily as needed for cough., Starting Mon 06/04/2017, Until Tue 06/04/2018, Print         Note:  This document was prepared using Dragon voice recognition software and may include unintentional dictation errors.    Clois ComberLittle, Judas Mohammad M, PA-C 06/04/17 1943    Jeanmarie PlantMcShane, James A, MD 06/05/17 Marlyne Beards0002

## 2017-08-01 IMAGING — US US MFM OB DETAIL+14 WK
1 series · 14 of 28 positions shown · non-contrast
Comparison: none

[Series 1: us mfm ob detail+14 wk · 0.22mm/px · 14 of 88 slices shown]
[im 4/88]
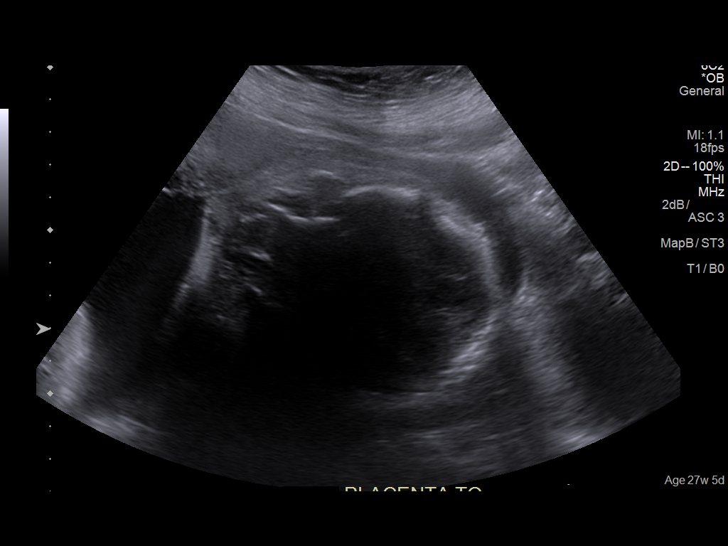
[im 10/88]
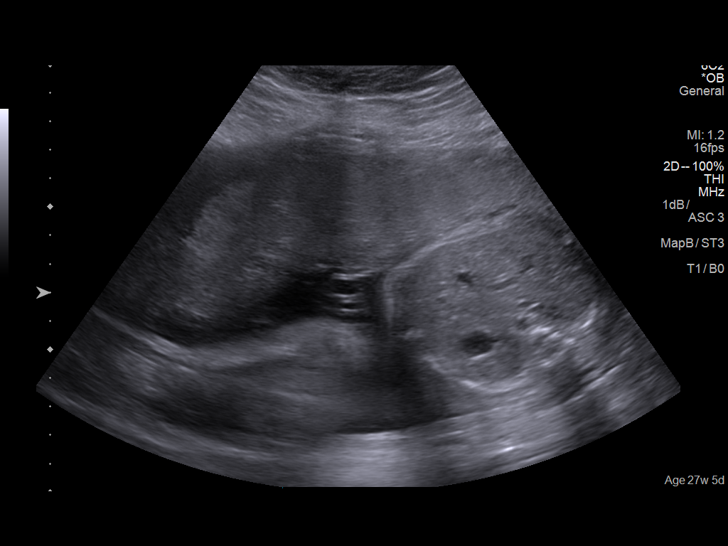
[im 17/88]
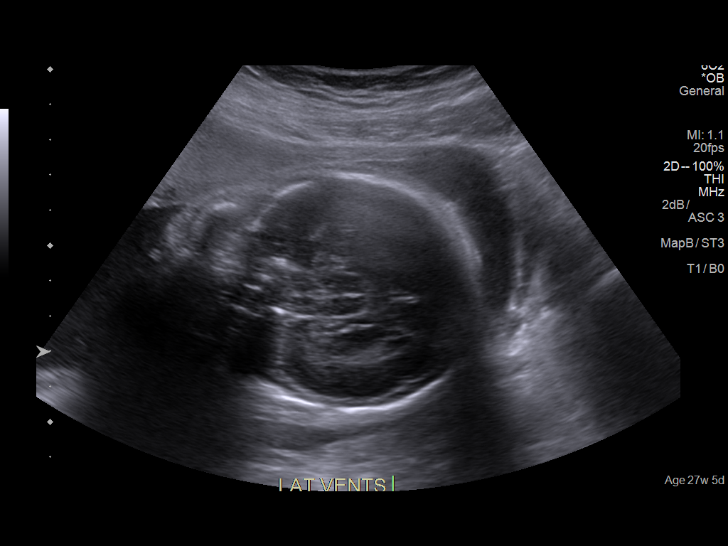
[im 23/88]
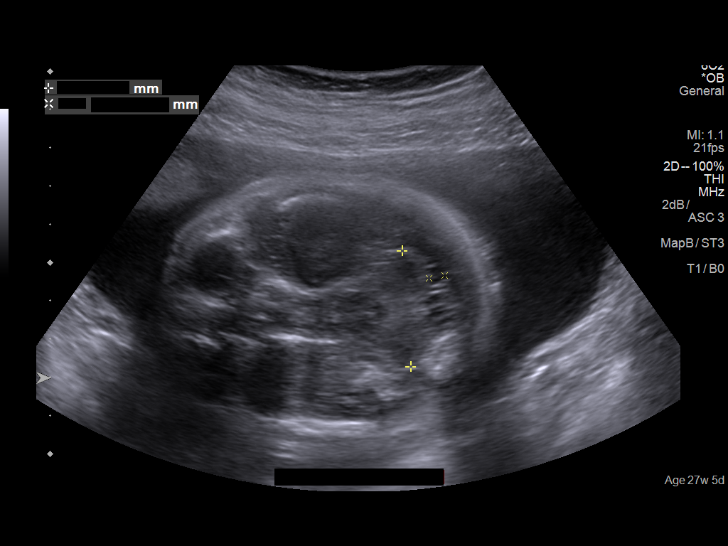
[im 30/88]
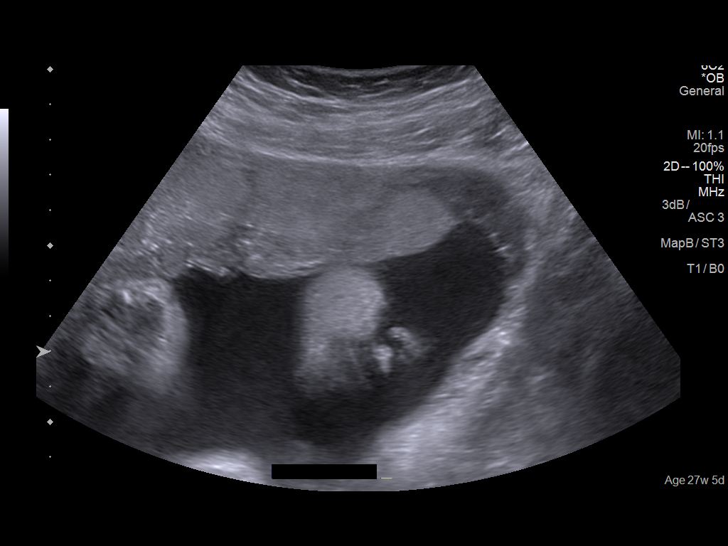
[im 36/88]
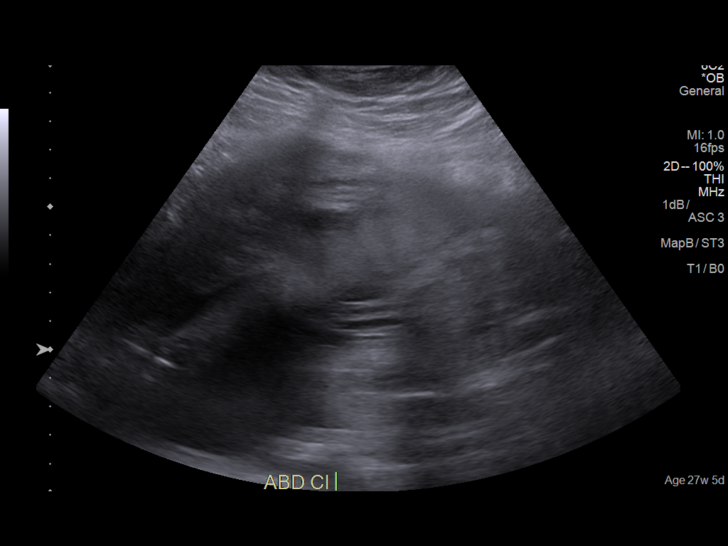
[im 42/88]
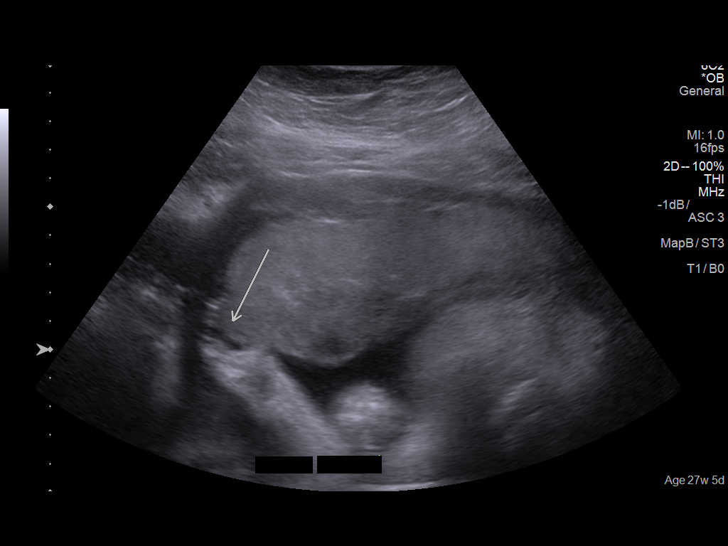
[im 49/88]
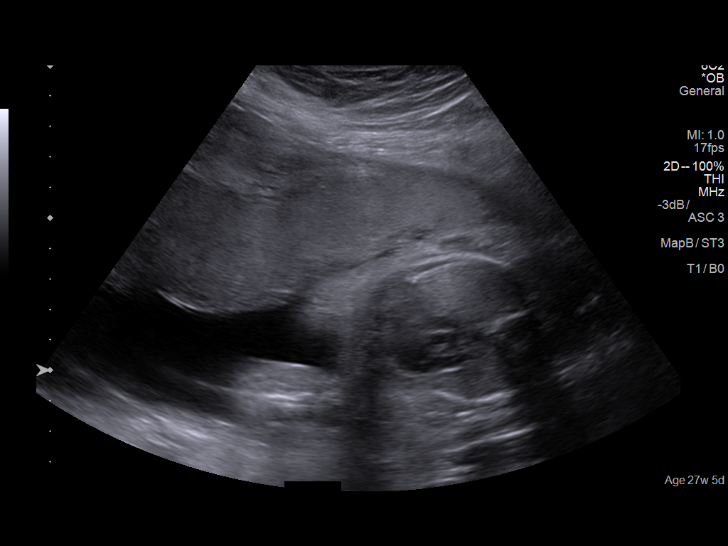
[im 55/88]
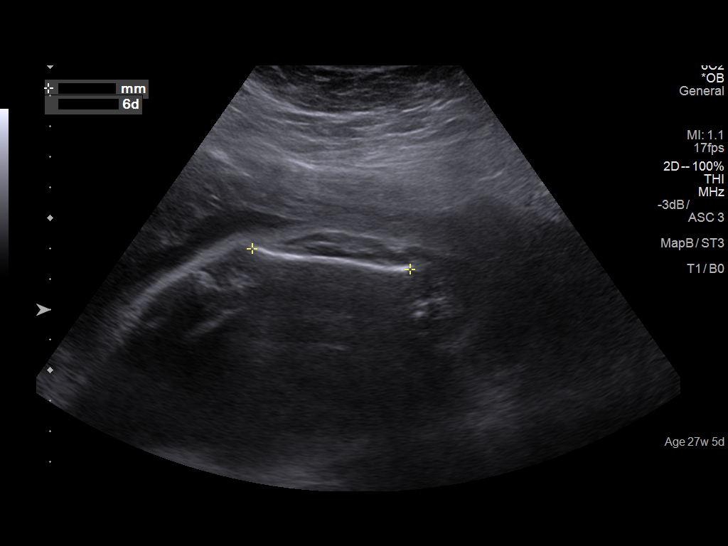
[im 62/88]
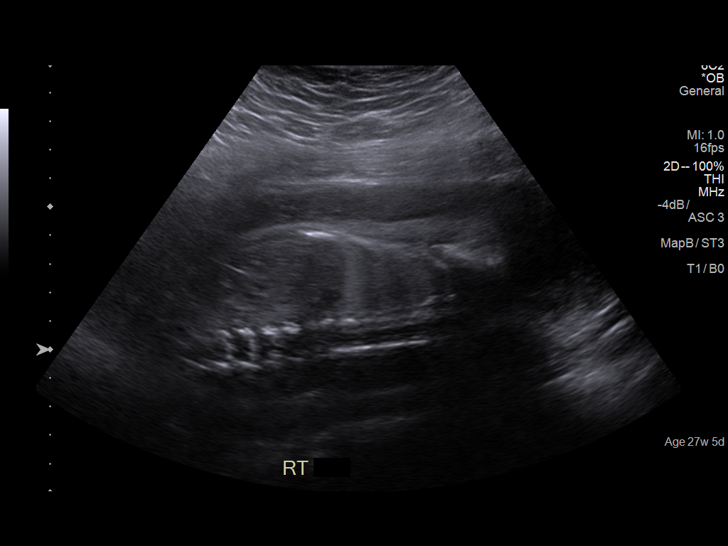
[im 68/88]
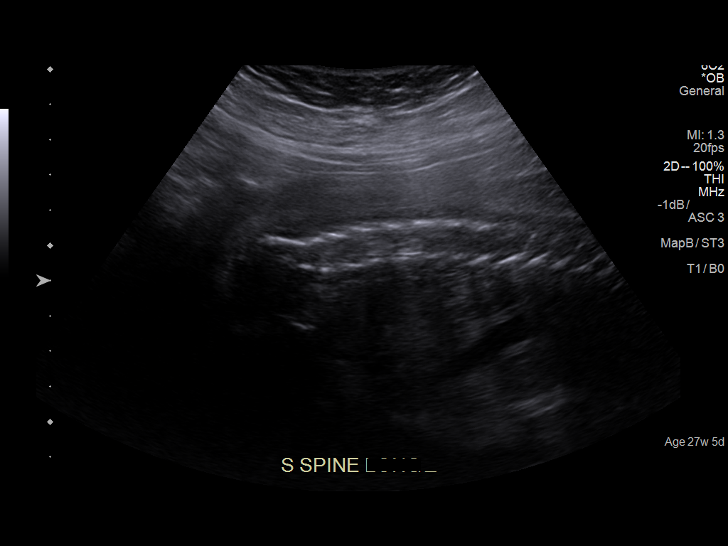
[im 75/88]
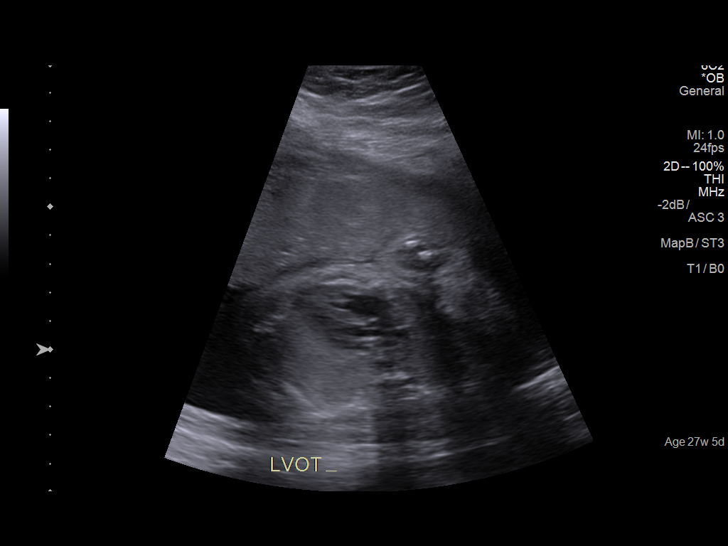
[im 81/88]
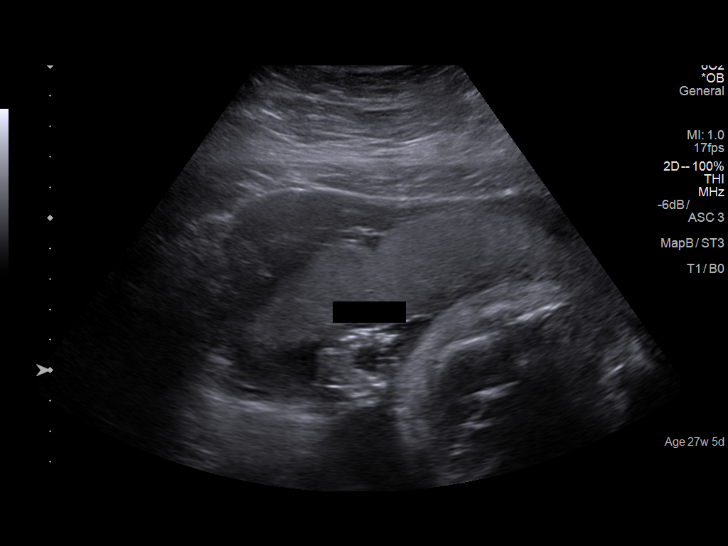
[im 88/88]
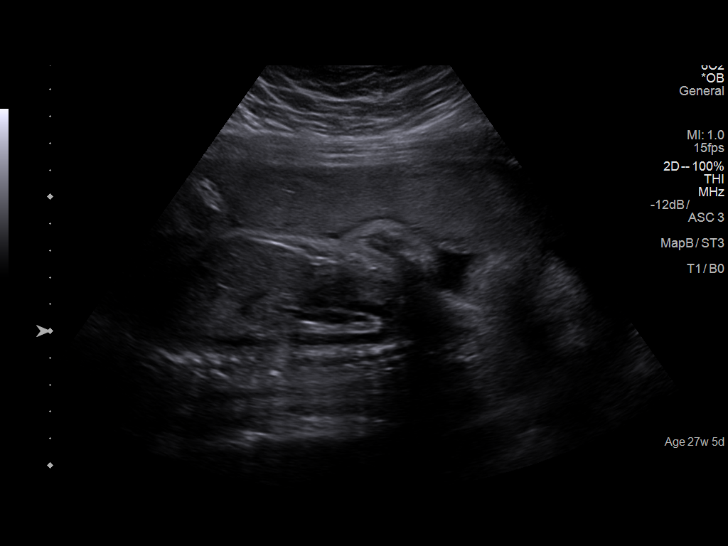

[14 of 28 positions shown; findings below may reference images not displayed]

Canned report from images found in remote index.

Refer to host system for actual result text.

## 2017-08-04 IMAGING — CR DG CHEST 2V
1 series · 2 of 2 positions shown · non-contrast
Comparison: 08/01/2014

CLINICAL DATA: Chest pain in a pregnant patient.

EXAM:
CHEST  2 VIEW

[Series 1: w chest pa · 0.14mm/px · 2 of 2 slices shown]
[im 1/2]
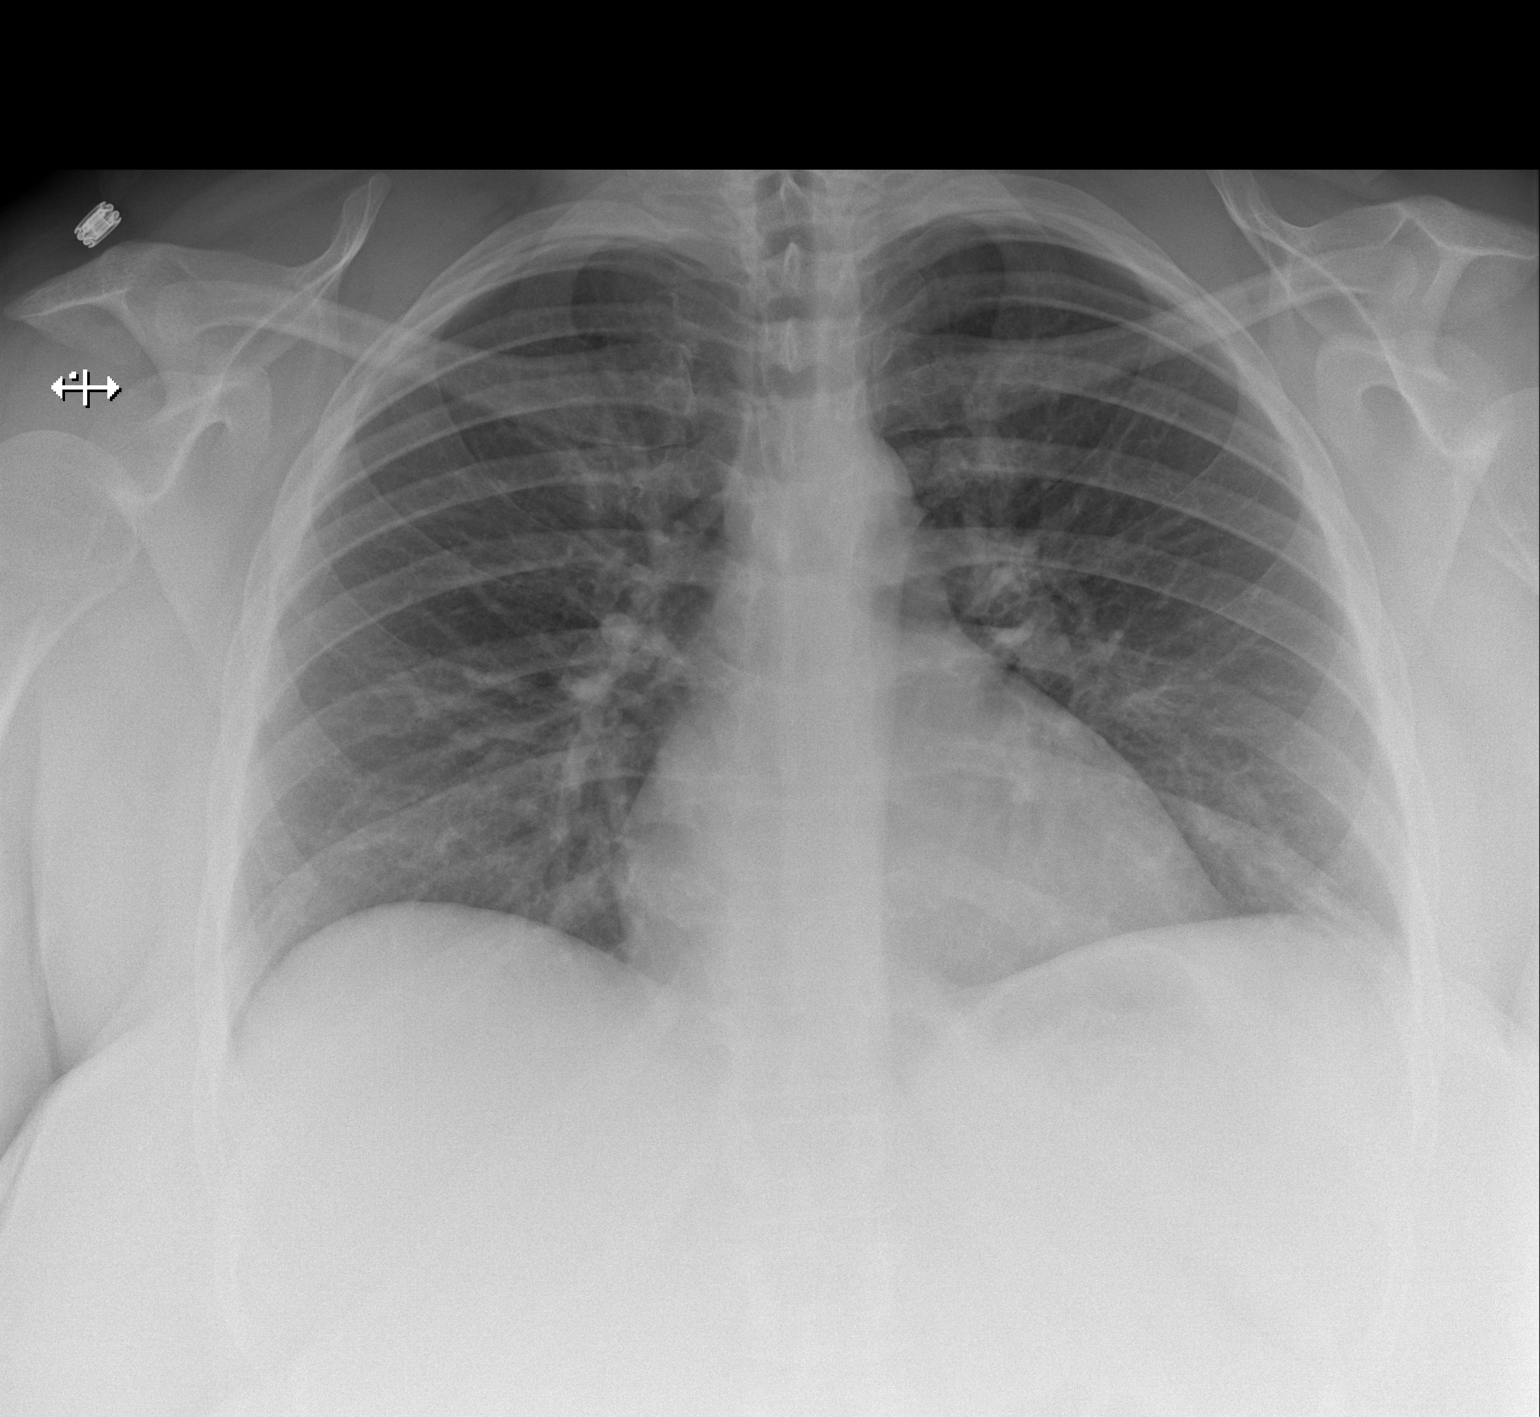
[im 2/2]
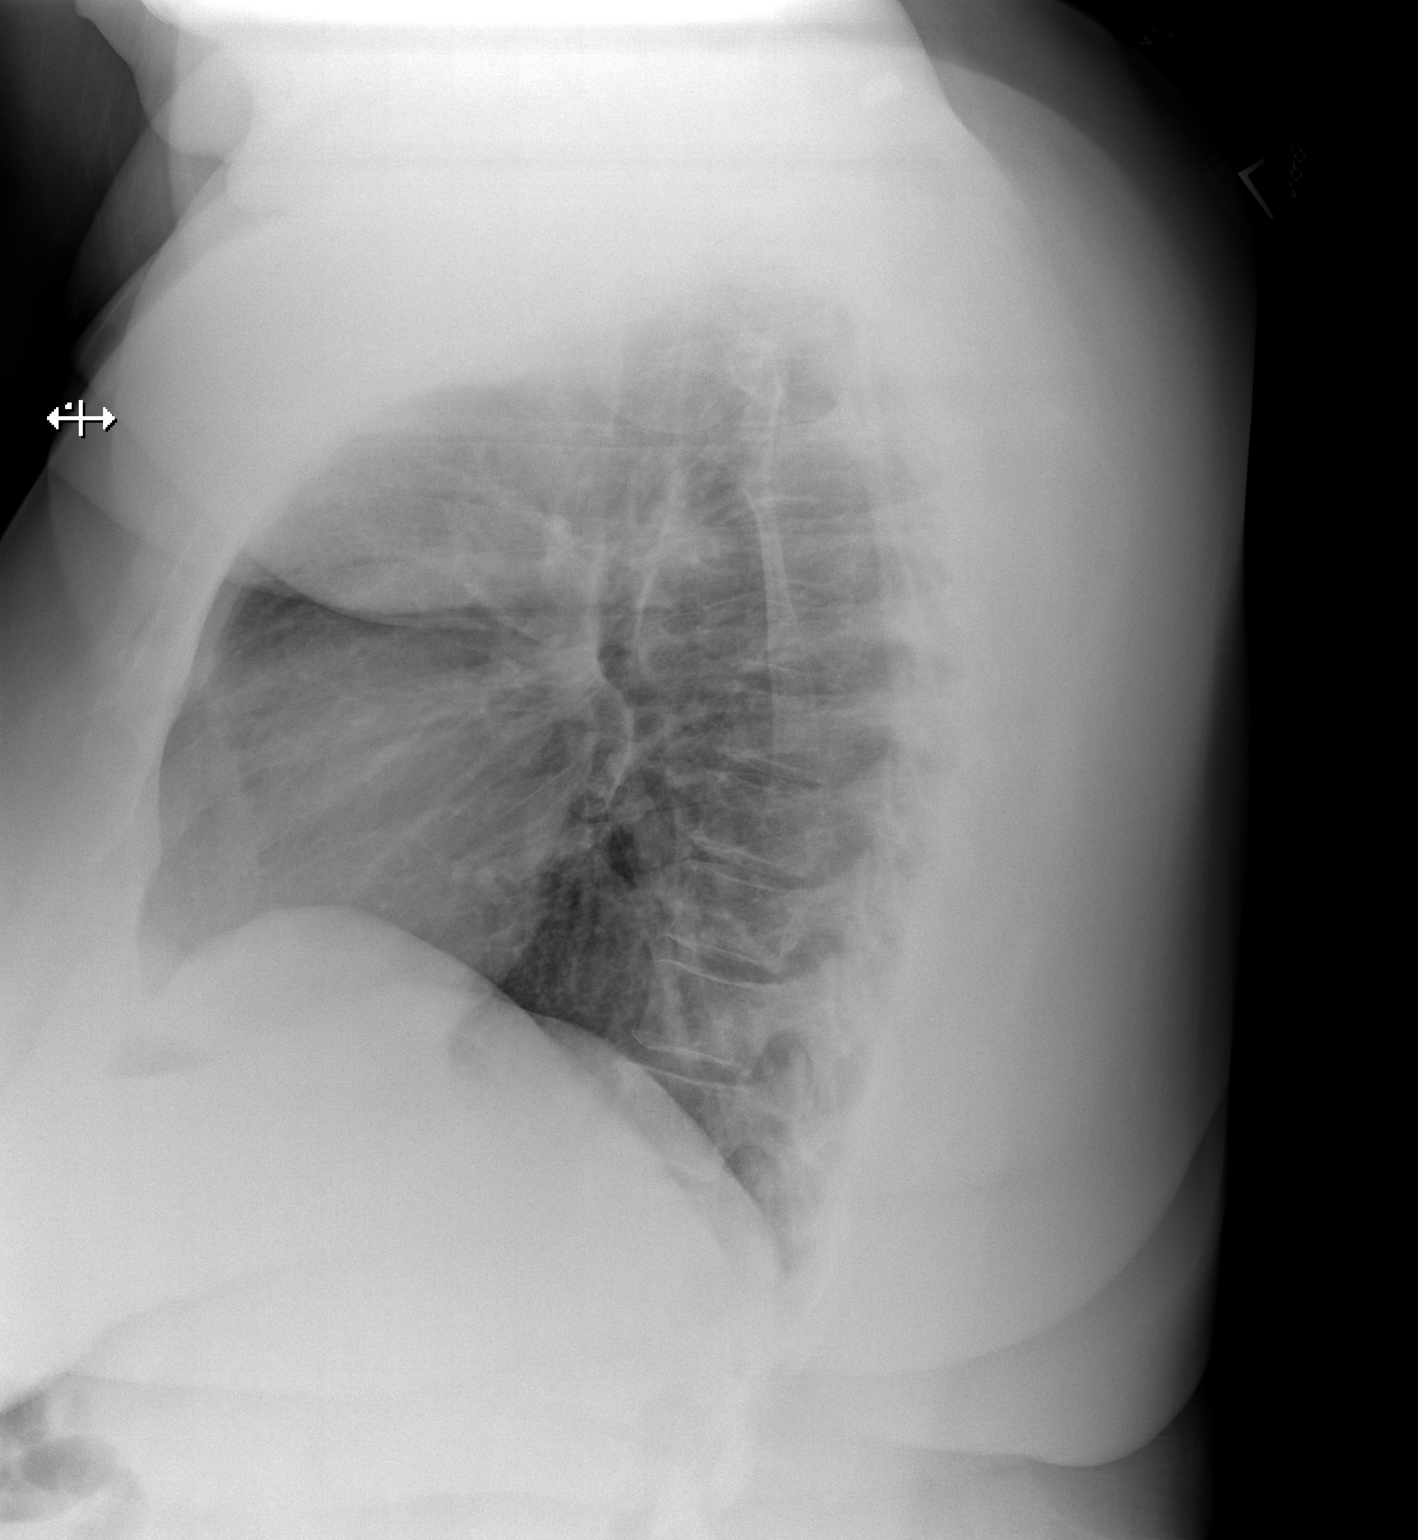

[2 of 2 positions shown; findings below may reference images not displayed]

FINDINGS: Cardiomediastinal silhouette is normal. Mediastinal contours appear
intact.

There is no evidence of focal airspace consolidation, pleural
effusion or pneumothorax.

Osseous structures are without acute abnormality. Soft tissues are
grossly normal.
IMPRESSION: No active cardiopulmonary disease.

## 2017-10-16 IMAGING — US US MFM FETAL BPP W/O NON-STRESS
1 series · 10 of 10 positions shown · non-contrast
Comparison: none

[Series 1: us mfm fetal bpp w/o non-stress · 10 acquisitions, 10 frames shown]
[im 1/10]
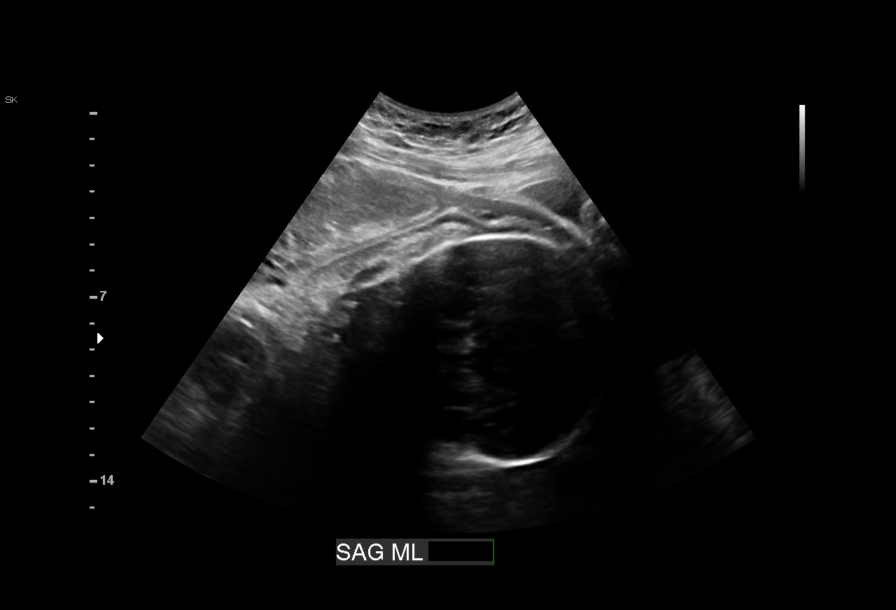
[im 2/10]
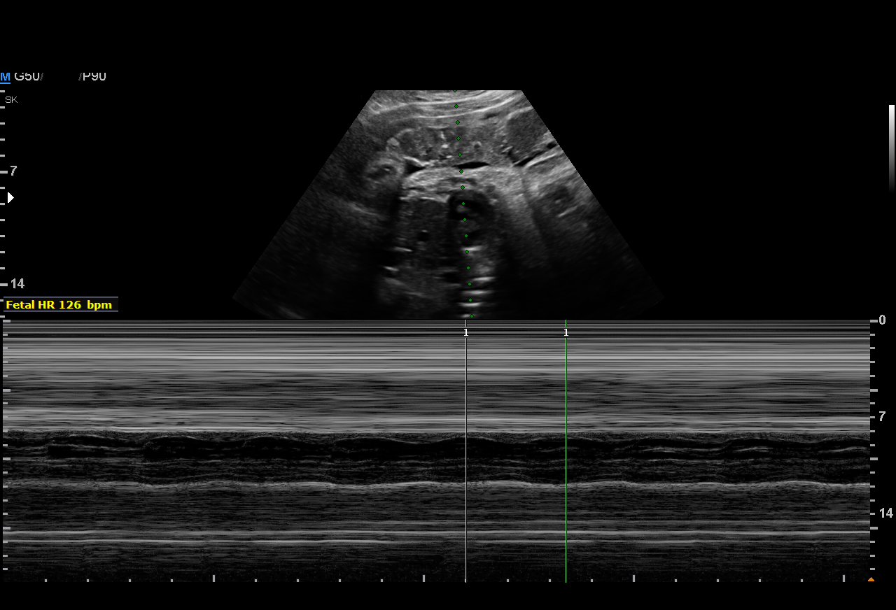
[im 3/10]
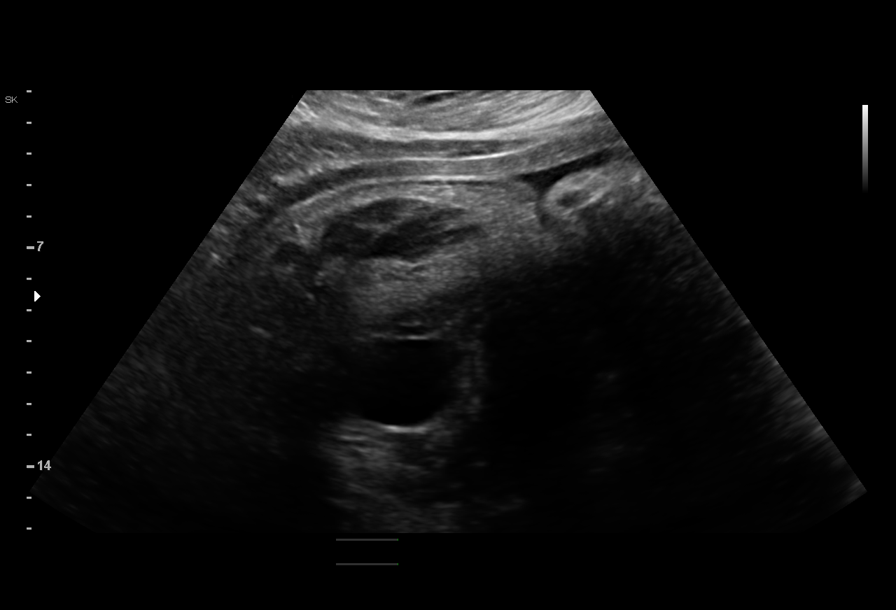
[im 4/10]
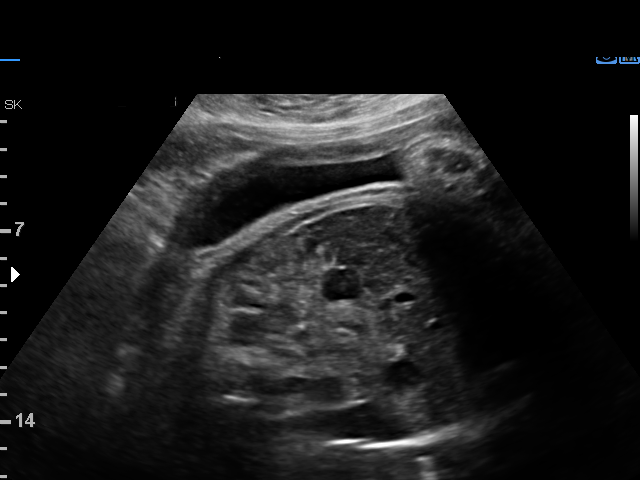
[im 5/10]
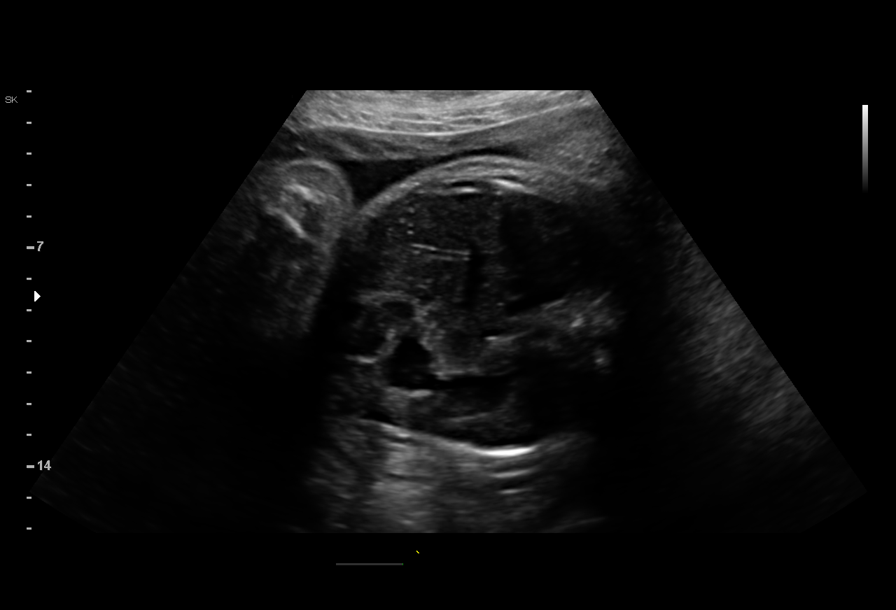
[im 6/10]
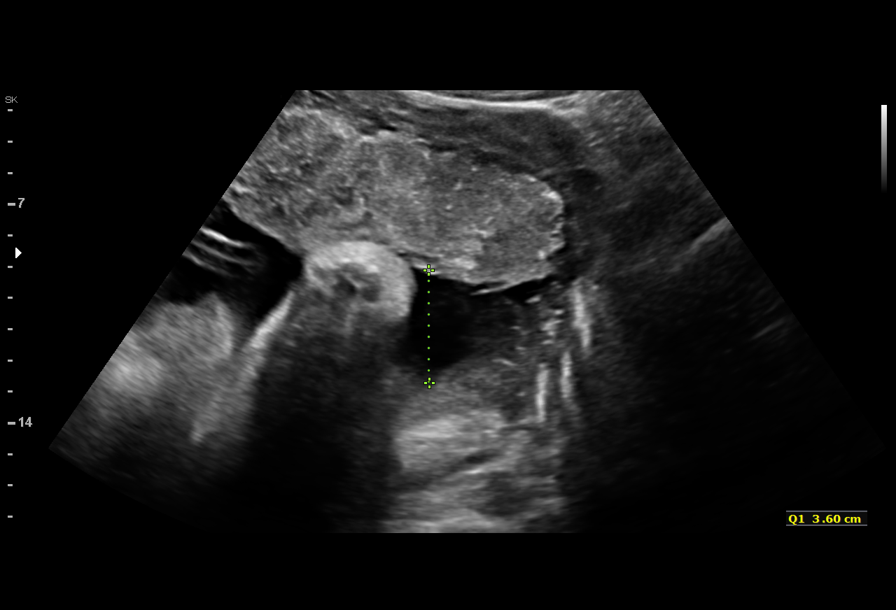
[im 7/10]
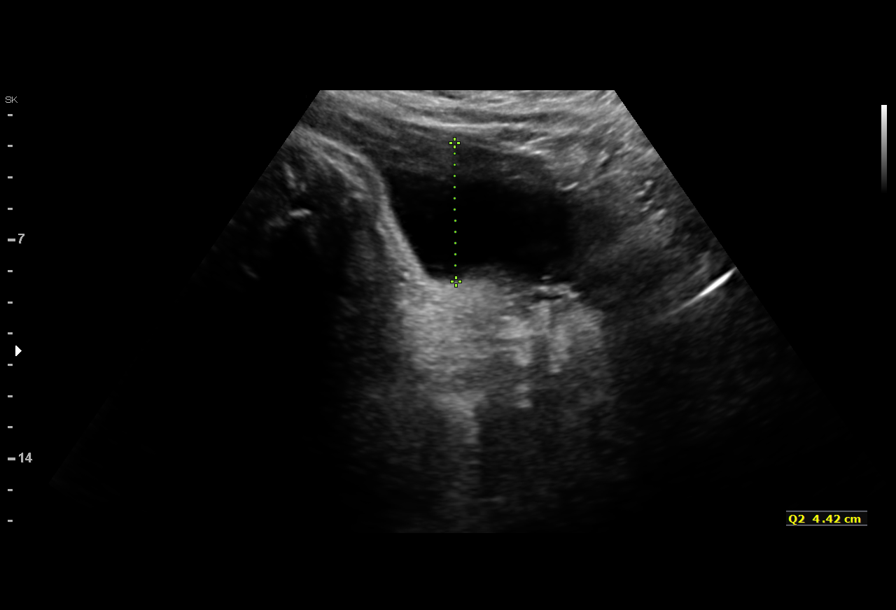
[im 8/10]
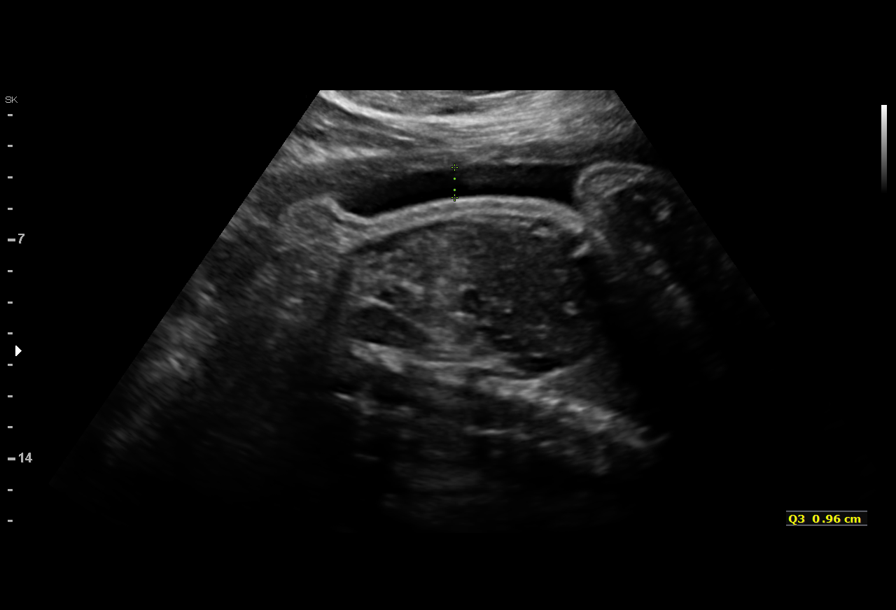
[im 9/10]
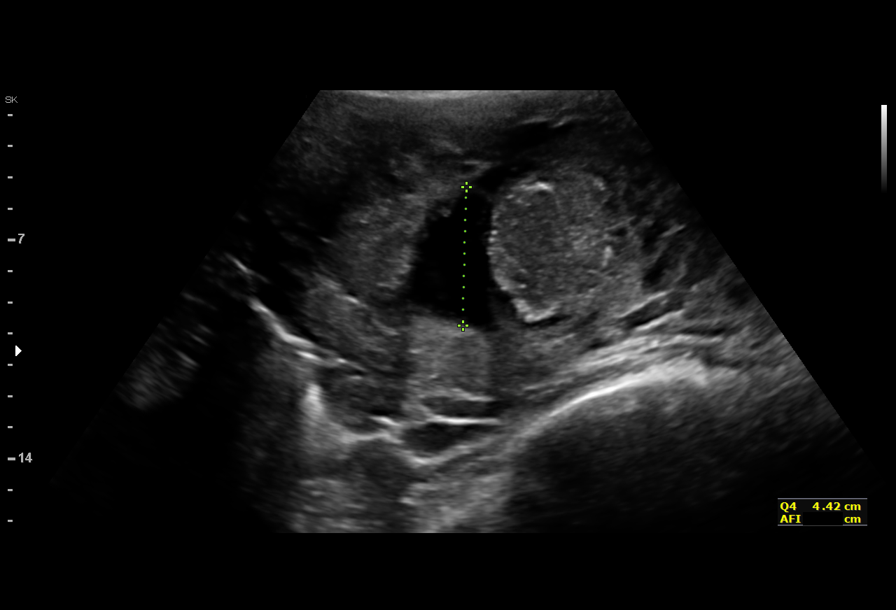
[im 10/10]
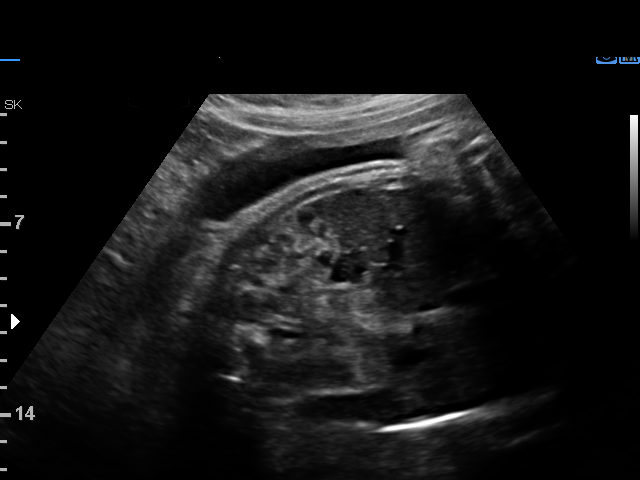

[10 of 10 positions shown; findings below may reference images not displayed]

1  BEDIG RAMEH            [id number]      [id number]     [id number]
Indications

38 weeks gestation of pregnancy
Non-reactive NST
Maternal morbid obesity
Insufficient Prenatal Care
Hypertension - Chronic/Pre-existing
OB History

Term:         2          SAB:   2
Fetal Evaluation

Num Of Fetuses:     1
Fetal Heart         126
Rate(bpm):
Cardiac Activity:   Observed
Presentation:       Cephalic
Placenta:           Anterior, above cervical os

Amniotic Fluid
AFI FV:      Subjectively within normal limits

AFI Sum(cm)     %Tile       Largest Pocket(cm)
13.4            51

RUQ(cm)       RLQ(cm)       LUQ(cm)        LLQ(cm)
3.6
Biophysical Evaluation

Amniotic F.V:   Within normal limits       F. Tone:        Observed
F. Movement:    Observed                   Score:          [DATE]
F. Breathing:   Observed
Gestational Age
Clinical EDD:  38w 1d                                        EDD:   08/16/16
Best:          38w 1d    Det. By:   Clinical EDD             EDD:   08/16/16
Anatomy

Stomach:               Appears normal, left   Bladder:                Appears normal
sided
Cervix Uterus Adnexa

Cervix
Not visualized (advanced GA >82wks)
Impression

SIUP at 38+1 weeks
Cephalic presentation
Normal amniotic fluid volume
BPP [DATE]
Recommendations

Follow-up as clinically indicated
